# Patient Record
Sex: Female | Born: 1985 | Race: Black or African American | Hispanic: No | Marital: Married | Smoking: Former smoker
Health system: Southern US, Community
[De-identification: ages and names within clinical notes are randomized; demographics above are authoritative.]

## PROBLEM LIST (undated history)

## (undated) DIAGNOSIS — F329 Major depressive disorder, single episode, unspecified: Secondary | ICD-10-CM

## (undated) DIAGNOSIS — D649 Anemia, unspecified: Secondary | ICD-10-CM

## (undated) DIAGNOSIS — F32A Depression, unspecified: Secondary | ICD-10-CM

## (undated) DIAGNOSIS — F419 Anxiety disorder, unspecified: Secondary | ICD-10-CM

## (undated) HISTORY — PX: NO PAST SURGERIES: SHX2092

## (undated) HISTORY — DX: Anemia, unspecified: D64.9

---

## 2014-03-26 ENCOUNTER — Encounter (HOSPITAL_COMMUNITY): Payer: Self-pay | Admitting: Emergency Medicine

## 2014-03-26 ENCOUNTER — Emergency Department (HOSPITAL_COMMUNITY)
Admission: EM | Admit: 2014-03-26 | Discharge: 2014-03-26 | Disposition: A | Discharge status: Home / Self Care | Payer: 59 | Attending: Emergency Medicine | Admitting: Emergency Medicine

## 2014-03-26 DIAGNOSIS — Z3202 Encounter for pregnancy test, result negative: Secondary | ICD-10-CM | POA: Insufficient documentation

## 2014-03-26 DIAGNOSIS — F3289 Other specified depressive episodes: Secondary | ICD-10-CM | POA: Insufficient documentation

## 2014-03-26 DIAGNOSIS — F329 Major depressive disorder, single episode, unspecified: Secondary | ICD-10-CM | POA: Insufficient documentation

## 2014-03-26 DIAGNOSIS — F411 Generalized anxiety disorder: Secondary | ICD-10-CM | POA: Insufficient documentation

## 2014-03-26 DIAGNOSIS — F419 Anxiety disorder, unspecified: Secondary | ICD-10-CM

## 2014-03-26 HISTORY — DX: Anxiety disorder, unspecified: F41.9

## 2014-03-26 HISTORY — DX: Major depressive disorder, single episode, unspecified: F32.9

## 2014-03-26 HISTORY — DX: Depression, unspecified: F32.A

## 2014-03-26 LAB — CBC WITH DIFFERENTIAL/PLATELET
BASOS ABS: 0 10*3/uL (ref 0.0–0.1)
BASOS PCT: 1 % (ref 0–1)
EOS ABS: 0.1 10*3/uL (ref 0.0–0.7)
EOS PCT: 1 % (ref 0–5)
HCT: 35.9 % — ABNORMAL LOW (ref 36.0–46.0)
Hemoglobin: 12.4 g/dL (ref 12.0–15.0)
Lymphocytes Relative: 32 % (ref 12–46)
Lymphs Abs: 1.8 10*3/uL (ref 0.7–4.0)
MCH: 30.4 pg (ref 26.0–34.0)
MCHC: 34.5 g/dL (ref 30.0–36.0)
MCV: 88 fL (ref 78.0–100.0)
Monocytes Absolute: 0.4 10*3/uL (ref 0.1–1.0)
Monocytes Relative: 7 % (ref 3–12)
Neutro Abs: 3.2 10*3/uL (ref 1.7–7.7)
Neutrophils Relative %: 59 % (ref 43–77)
PLATELETS: 225 10*3/uL (ref 150–400)
RBC: 4.08 MIL/uL (ref 3.87–5.11)
RDW: 14.7 % (ref 11.5–15.5)
WBC: 5.5 10*3/uL (ref 4.0–10.5)

## 2014-03-26 LAB — COMPREHENSIVE METABOLIC PANEL
ALT: 12 U/L (ref 0–35)
AST: 16 U/L (ref 0–37)
Albumin: 4.2 g/dL (ref 3.5–5.2)
Alkaline Phosphatase: 73 U/L (ref 39–117)
Anion gap: 12 (ref 5–15)
BUN: 9 mg/dL (ref 6–23)
CALCIUM: 10 mg/dL (ref 8.4–10.5)
CO2: 25 mEq/L (ref 19–32)
Chloride: 100 mEq/L (ref 96–112)
Creatinine, Ser: 0.79 mg/dL (ref 0.50–1.10)
GFR calc non Af Amer: >90 mL/min (ref >90)
Glucose, Bld: 84 mg/dL (ref 70–99)
Potassium: 3.7 mEq/L (ref 3.7–5.3)
SODIUM: 137 meq/L (ref 137–147)
TOTAL PROTEIN: 8.1 g/dL (ref 6.0–8.3)
Total Bilirubin: 0.5 mg/dL (ref 0.3–1.2)

## 2014-03-26 LAB — POC URINE PREG, ED: Preg Test, Ur: NEGATIVE

## 2014-03-26 LAB — ETHANOL: Alcohol, Ethyl (B): <11 mg/dL (ref 0–11)

## 2014-03-26 LAB — RAPID URINE DRUG SCREEN, HOSP PERFORMED
Amphetamines: NOT DETECTED
BARBITURATES: NOT DETECTED
Benzodiazepines: NOT DETECTED
COCAINE: NOT DETECTED
Opiates: NOT DETECTED
TETRAHYDROCANNABINOL: POSITIVE — AB

## 2014-03-26 MED ORDER — LORAZEPAM 1 MG PO TABS: 1.0000 mg | ORAL_TABLET | Freq: Three times a day (TID) | ORAL | Status: DC | PRN

## 2014-03-26 NOTE — ED Notes (Signed)
Pt has blue pants blue bra white shirt pink underwear and black flip flops pts pocketbook and cell phone given to friend
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
## 2014-03-26 NOTE — [facility] Suicide Risk Assessment (Signed)
Suicide Risk Assessment  Discharge Assessment     Demographic Factors:  Gay, lesbian, or bisexual orientation  Total Time spent with patient: 30 minutes  Psychiatric Specialty Exam:     Blood pressure 111/70, pulse 66, temperature 98.3 F (36.8 C), temperature source Oral, resp. rate 16, SpO2 100.00%.There is no height or weight on file to calculate BMI.  General Appearance: Well Groomed  Engineer, water::  Good  Speech:  Clear and Coherent  Volume:  Normal  Mood:  Anxious  Affect:  Appropriate  Thought Process:  Negative  Orientation:  Full (Time, Place, and Person)  Thought Content:  Negative  Suicidal Thoughts:  No  Homicidal Thoughts:  No  Memory:  Immediate;   Good Recent;   Good Remote;   Good  Judgement:  Good  Insight:  Good  Psychomotor Activity:  Normal  Concentration:  Good  Recall:  Good  Fund of Knowledge:Good  Language: Good  Akathisia:  Negative  Handed:  Right  AIMS (if indicated):     Assets:  Communication Skills Desire for Improvement Financial Resources/Insurance Housing Intimacy Leisure Time [place] Talents/Skills Transportation Vocational/Educational  Sleep:       Musculoskeletal: Strength & Muscle Tone: within normal limits Gait & Station: normal Patient leans: N/A   Mental Status Per Nursing Assessment::   On Admission:     Current Mental Status by Physician: NA  Loss Factors: NA  Historical Factors: NA  Risk Reduction Factors:   Employed, Living with another person, especially a relative, Positive social support and Positive coping skills or problem solving skills  Continued Clinical Symptoms:  Severe Anxiety and/or Agitation  Cognitive Features That Contribute To Risk:  none  Suicide Risk:  Minimal: No identifiable suicidal ideation.  Patients presenting with no risk factors but with morbid ruminations; may be classified as minimal risk based on the severity of the depressive  symptoms  Discharge Diagnoses:   AXIS I:  Anxiety Disorder NOS AXIS II:  Deferred AXIS III:   Past Medical History  Diagnosis Date  . Depression   . Anxiety    AXIS IV:  family losses AXIS V:  61-70 mild symptoms  Plan Of Care/Follow-up recommendations:  Activity:  resume usual activity Diet:  resume usual diet  Is patient on multiple antipsychotic therapies at discharge:  No   Has Patient had three or more failed trials of antipsychotic monotherapy by history:  No  Recommended Plan for Multiple Antipsychotic Therapies: NA    Sandra Gregory D 03/26/2014, 3:05 PM

## 2014-03-26 NOTE — ED Notes (Signed)
Pt c/o increased anxiety, mood swings, and depression.  Report intermittent SI w/ plan to take pills.  However, Pt doesn't have access to pills.  Denies HI and hallucinations.  Denies medical complaints.

## 2014-03-26 NOTE — ED Provider Notes (Signed)
CSN: [id number]     Arrival date & time 03/26/14  8099 History   First MD Initiated Contact with Patient 03/26/14 0945     No chief complaint on file.    (Consider location/radiation/quality/duration/timing/severity/associated sxs/prior Treatment) HPI Comments: The patient presents to the emergency department for evaluation of anxiety and depression. Patient reports that she has had a long history of anxiety depression, going back all the way to what her mother died when she was 51. Patient has had suicidal thoughts and attempts in the past, but some has never had psychiatric treatment. The patient reports that recently she has had increasing symptoms, is concerned that she will become suicidal again. She currently is not suicidal or homicidal.   No past medical history on file. No past surgical history on file. No family history on file. History  Substance Use Topics  . Smoking status: Not on file  . Smokeless tobacco: Not on file  . Alcohol Use: Not on file   OB History   No data available     Review of Systems  Psychiatric/Behavioral: Positive for dysphoric mood. Negative for suicidal ideas. The patient is nervous/anxious.   All other systems reviewed and are negative.     Allergies  Review of patient's allergies indicates not on file.  Home Medications   Prior to Admission medications   Not on File   There were no vitals taken for this visit. Physical Exam  Constitutional: She is oriented to person, place, and time. She appears well-developed and well-nourished. No distress.  HENT:  Head: Normocephalic and atraumatic.  Right Ear: Hearing normal.  Left Ear: Hearing normal.  Nose: Nose normal.  Mouth/Throat: Oropharynx is clear and moist and mucous membranes are normal.  Eyes: Conjunctivae and EOM are normal. Pupils are equal, round, and reactive to light.  Neck: Normal range of motion. Neck supple.  Cardiovascular: Regular rhythm, S1 normal and S2 normal.  Exam  reveals no gallop and no friction rub.   No murmur heard. Pulmonary/Chest: Effort normal and breath sounds normal. No respiratory distress. She exhibits no tenderness.  Abdominal: Soft. Normal appearance and bowel sounds are normal. There is no hepatosplenomegaly. There is no tenderness. There is no rebound, no guarding, no tenderness at McBurney's point and negative Murphy's sign. No hernia.  Musculoskeletal: Normal range of motion.  Neurological: She is alert and oriented to person, place, and time. She has normal strength. No cranial nerve deficit or sensory deficit. Coordination normal. GCS eye subscore is 4. GCS verbal subscore is 5. GCS motor subscore is 6.  Skin: Skin is warm, dry and intact. No rash noted. No cyanosis.  Psychiatric: She has a normal mood and affect. Her speech is normal and behavior is normal. Thought content normal.    ED Course  Procedures (including critical care time) Labs Review Labs Reviewed - No data to display  Imaging Review No results found.   EKG Interpretation None      MDM   Final diagnoses:  None   anxiety  Depression  Presents to the ER for concerns of increasing anxiety and depression. Patient has a long history of untreated anxiety and depression, including suicidal behavior in the past. She currently is not homicidal or suicidal, but is concerned that her worsening symptoms indicate that she might become suicidal again. She has no other complaints, is medically clear for psychiatric evaluation.    Orpah Greek, MD 03/26/14 [phone]

## 2014-03-26 NOTE — ED Notes (Signed)
Pt belongings placed in locker 27

## 2014-03-26 NOTE — ED Notes (Signed)
Belongings returned to pt
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
## 2019-12-23 ENCOUNTER — Other Ambulatory Visit: Payer: Self-pay

## 2019-12-23 ENCOUNTER — Inpatient Hospital Stay (HOSPITAL_COMMUNITY): Payer: 59

## 2019-12-23 ENCOUNTER — Telehealth: Payer: Self-pay

## 2019-12-23 ENCOUNTER — Encounter (HOSPITAL_COMMUNITY): Payer: Self-pay | Admitting: Obstetrics and Gynecology

## 2019-12-23 ENCOUNTER — Inpatient Hospital Stay (HOSPITAL_COMMUNITY)
Admission: AD | Admit: 2019-12-23 | Discharge: 2019-12-24 | Disposition: A | Discharge status: Home / Self Care | Payer: 59 | Attending: Obstetrics and Gynecology | Admitting: Obstetrics and Gynecology

## 2019-12-23 DIAGNOSIS — Z3A15 15 weeks gestation of pregnancy: Secondary | ICD-10-CM | POA: Diagnosis not present

## 2019-12-23 DIAGNOSIS — O99891 Other specified diseases and conditions complicating pregnancy: Secondary | ICD-10-CM | POA: Diagnosis not present

## 2019-12-23 DIAGNOSIS — O3412 Maternal care for benign tumor of corpus uteri, second trimester: Secondary | ICD-10-CM | POA: Diagnosis not present

## 2019-12-23 DIAGNOSIS — D259 Leiomyoma of uterus, unspecified: Secondary | ICD-10-CM

## 2019-12-23 DIAGNOSIS — Z87891 Personal history of nicotine dependence: Secondary | ICD-10-CM | POA: Diagnosis not present

## 2019-12-23 DIAGNOSIS — D251 Intramural leiomyoma of uterus: Secondary | ICD-10-CM | POA: Insufficient documentation

## 2019-12-23 DIAGNOSIS — R1031 Right lower quadrant pain: Secondary | ICD-10-CM

## 2019-12-23 DIAGNOSIS — O26892 Other specified pregnancy related conditions, second trimester: Secondary | ICD-10-CM | POA: Diagnosis present

## 2019-12-23 LAB — URINALYSIS, ROUTINE W REFLEX MICROSCOPIC
Bilirubin Urine: NEGATIVE
Glucose, UA: NEGATIVE mg/dL
Hgb urine dipstick: NEGATIVE
Ketones, ur: NEGATIVE mg/dL
Leukocytes,Ua: NEGATIVE
Nitrite: NEGATIVE
Protein, ur: NEGATIVE mg/dL
Specific Gravity, Urine: 1.01 (ref 1.005–1.030)
pH: 6 (ref 5.0–8.0)

## 2019-12-23 LAB — COMPREHENSIVE METABOLIC PANEL
ALT: 12 U/L (ref 0–44)
AST: 14 U/L — ABNORMAL LOW (ref 15–41)
Albumin: 3.5 g/dL (ref 3.5–5.0)
Alkaline Phosphatase: 46 U/L (ref 38–126)
Anion gap: 9 (ref 5–15)
BUN: 6 mg/dL (ref 6–20)
CO2: 24 mmol/L (ref 22–32)
Calcium: 9.3 mg/dL (ref 8.9–10.3)
Chloride: 100 mmol/L (ref 98–111)
Creatinine, Ser: 0.76 mg/dL (ref 0.44–1.00)
GFR calc Af Amer: >60 mL/min (ref >60)
GFR calc non Af Amer: >60 mL/min (ref >60)
Glucose, Bld: 90 mg/dL (ref 70–99)
Potassium: 3.4 mmol/L — ABNORMAL LOW (ref 3.5–5.1)
Sodium: 133 mmol/L — ABNORMAL LOW (ref 135–145)
Total Bilirubin: 0.6 mg/dL (ref 0.3–1.2)
Total Protein: 7 g/dL (ref 6.5–8.1)

## 2019-12-23 LAB — CBC
HCT: 32.6 % — ABNORMAL LOW (ref 36.0–46.0)
Hemoglobin: 11.3 g/dL — ABNORMAL LOW (ref 12.0–15.0)
MCH: 31.5 pg (ref 26.0–34.0)
MCHC: 34.7 g/dL (ref 30.0–36.0)
MCV: 90.8 fL (ref 80.0–100.0)
Platelets: 226 10*3/uL (ref 150–400)
RBC: 3.59 MIL/uL — ABNORMAL LOW (ref 3.87–5.11)
RDW: 13 % (ref 11.5–15.5)
WBC: 10 10*3/uL (ref 4.0–10.5)
nRBC: 0 % (ref 0.0–0.2)

## 2019-12-23 LAB — WET PREP, GENITAL
Clue Cells Wet Prep HPF POC: NONE SEEN
Sperm: NONE SEEN
Trich, Wet Prep: NONE SEEN
Yeast Wet Prep HPF POC: NONE SEEN

## 2019-12-23 LAB — POCT PREGNANCY, URINE: Preg Test, Ur: POSITIVE — AB

## 2019-12-23 MED ORDER — HYDROMORPHONE HCL 1 MG/ML IJ SOLN: 0.5000 mg | Freq: Once | INTRAMUSCULAR | Status: DC

## 2019-12-23 MED ORDER — HYDROMORPHONE HCL 1 MG/ML IJ SOLN
0.5000 mg | Freq: Once | INTRAMUSCULAR | Status: AC
  Administered 2019-12-23: 20:00:00 0.5 mg via INTRAMUSCULAR
  Filled 2019-12-23: qty 1

## 2019-12-23 NOTE — MAU Provider Note (Addendum)
History   Sandra Gregory is a 34 yo G2P1001 at approximately [redacted] wks gestation who presents with sharp pain in the RLQ. Exact gestational age is unknown. She and her wife have been trying to get pregnant using the "[place]" method with her wife's brother's sperm since last June. She believed herself to be at [redacted] weeks gestation based on some vaginal bleeding she had in February that she believed to be menses. However, she believes that she is "showing" more than 8 weeks. Her fundal height is 15 cm.     Her abdominal pain started around 1am and has been persistent since. At first the pain was present along her entire R flank, but has since localized to the the RLQ. She describes it as "shooting" pain that is a 5-6/10 at rest and a 10/10 with movement. She is most comfortable with her hips flexed and turned slightly on her R side. She reports that she cannot lie on her L side due to the severity of the pain. She has not been eating, but has been able to maintain PO fluid intake.   She has not taken any medications for the pain, but did try to take a warm bath with no relief of her symptoms. She endorses mild to moderate nausea but denies any nausea. She denies back pain, dysuria, or changes in urinary frequency. She has not had a stool today, but reports normal stools yesterday. She reports clear vaginal discharge but no bleeding.    CSN: [id number]  Arrival date and time: 12/23/19 1624   First Provider Initiated Contact with Patient 12/23/19 1719      Chief Complaint  Patient presents with  . Abdominal Pain    OB History    Gravida  2   Para  1   Term  1   Preterm      AB      Living  1     SAB      TAB      Ectopic      Multiple      Live Births  1           Past Medical History:  Diagnosis Date  . Anxiety   . Depression     Past Surgical History:  Procedure Laterality Date  . NO PAST SURGERIES      No family history on file.  Social History   Tobacco Use   . Smoking status: Former Smoker    Types: Cigarettes    Quit date: 11/22/2019    Years since quitting: 0.0  . Smokeless tobacco: Never Used  Substance Use Topics  . Alcohol use: Not Currently  . Drug use: Not Currently    Types: Marijuana    Comment: not since pregnancy    Allergies: No Known Allergies  Medications Prior to Admission  Medication Sig Dispense Refill Last Dose  . Prenatal Vit-Fe Fumarate-FA (PRENATAL MULTIVITAMIN) TABS tablet Take 1 tablet by mouth daily at 12 noon.     . diphenhydrAMINE (BENADRYL) 25 MG tablet Take 50 mg by mouth every 6 (six) hours as needed for allergies.     . fexofenadine (ALLEGRA) 180 MG tablet Take 180 mg by mouth daily.       Review of Systems  Constitutional: Negative.   HENT: Negative.   Eyes: Negative.   Cardiovascular: Negative.   Gastrointestinal: Positive for abdominal pain and nausea. Negative for abdominal distention, anal bleeding, blood in stool, constipation, diarrhea, rectal pain and vomiting.  Endocrine: Negative.   Genitourinary: Positive for vaginal discharge. Negative for difficulty urinating, dysuria, flank pain, vaginal bleeding and vaginal pain.  Musculoskeletal: Negative for back pain.  Skin: Negative.   Allergic/Immunologic: Negative.   Neurological: Negative.   Hematological: Negative.   Psychiatric/Behavioral: Negative.    Physical Exam   Blood pressure 123/75, pulse 91, temperature 99.5 F (37.5 C), temperature source Oral, resp. rate 17, height 5\' 6"  (1.676 m), weight 87.6 kg, last menstrual period 10/25/2019, SpO2 99 %.  Physical Exam  Constitutional: She appears well-developed and well-nourished.  Cardiovascular: Normal rate and regular rhythm. Exam reveals no gallop and no friction rub.  No murmur heard. Respiratory: Effort normal and breath sounds normal. No respiratory distress. She has no wheezes. She has no rales.  GI: She exhibits no distension and no mass. There is abdominal tenderness. There is  guarding. There is no rebound.  Pos. Rovsing sign Neg. McBurney's sign Neg. Psoas sign Neg. Obturator sign No ascites  Genitourinary:    Vaginal discharge (pale) present.     Genitourinary Comments: No cervical motion tenderness   Skin: Skin is warm and dry.    MAU Course   MDM - Wet prep significant for moderate WBCs - Ordered STAT MRI abdomen & pelvis  - Ordered CBC & CMP to rule out appendicitis   Results for orders placed or performed during the hospital encounter of 12/23/19 (from the past 24 hour(s))  Pregnancy, urine POC     Status: Abnormal   Collection Time: 12/23/19  4:48 PM  Result Value Ref Range   Preg Test, Ur POSITIVE (A) NEGATIVE  Urinalysis, Routine w reflex microscopic     Status: None   Collection Time: 12/23/19  4:50 PM  Result Value Ref Range   Color, Urine YELLOW YELLOW   APPearance CLEAR CLEAR   Specific Gravity, Urine 1.010 1.005 - 1.030   pH 6.0 5.0 - 8.0   Glucose, UA NEGATIVE NEGATIVE mg/dL   Hgb urine dipstick NEGATIVE NEGATIVE   Bilirubin Urine NEGATIVE NEGATIVE   Ketones, ur NEGATIVE NEGATIVE mg/dL   Protein, ur NEGATIVE NEGATIVE mg/dL   Nitrite NEGATIVE NEGATIVE   Leukocytes,Ua NEGATIVE NEGATIVE  Wet prep, genital     Status: Abnormal   Collection Time: 12/23/19  5:45 PM   Specimen: Vaginal  Result Value Ref Range   Yeast Wet Prep HPF POC NONE SEEN NONE SEEN   Trich, Wet Prep NONE SEEN NONE SEEN   Clue Cells Wet Prep HPF POC NONE SEEN NONE SEEN   WBC, Wet Prep HPF POC MODERATE (A) NONE SEEN   Sperm NONE SEEN   Comprehensive metabolic panel     Status: Abnormal   Collection Time: 12/23/19  6:10 PM  Result Value Ref Range   Sodium 133 (L) 135 - 145 mmol/L   Potassium 3.4 (L) 3.5 - 5.1 mmol/L   Chloride 100 98 - 111 mmol/L   CO2 24 22 - 32 mmol/L   Glucose, Bld 90 70 - 99 mg/dL   BUN 6 6 - 20 mg/dL   Creatinine, Ser 0.76 0.44 - 1.00 mg/dL   Calcium 9.3 8.9 - 10.3 mg/dL   Total Protein 7.0 6.5 - 8.1 g/dL   Albumin 3.5 3.5 - 5.0  g/dL   AST 14 (L) 15 - 41 U/L   ALT 12 0 - 44 U/L   Alkaline Phosphatase 46 38 - 126 U/L   Total Bilirubin 0.6 0.3 - 1.2 mg/dL   GFR calc non Af Amer >60 >  60 mL/min   GFR calc Af Amer >60 >60 mL/min   Anion gap 9 5 - 15  CBC     Status: Abnormal   Collection Time: 12/23/19  6:10 PM  Result Value Ref Range   WBC 10.0 4.0 - 10.5 K/uL   RBC 3.59 (L) 3.87 - 5.11 MIL/uL   Hemoglobin 11.3 (L) 12.0 - 15.0 g/dL   HCT 32.6 (L) 36.0 - 46.0 %   MCV 90.8 80.0 - 100.0 fL   MCH 31.5 26.0 - 34.0 pg   MCHC 34.7 30.0 - 36.0 g/dL   RDW 13.0 11.5 - 15.5 %   Platelets 226 150 - 400 K/uL   nRBC 0.0 0.0 - 0.2 %   Pearla Dubonnet 12/23/2019, 5:50 PM   GME ATTESTATION:  I saw and evaluated the patient. I agree with the findings and the plan of care as documented in the student's note.  Merilyn Baba, DO OB Fellow, [place] 12/23/2019 10:03 PM  RECHECK 2000: - Pain better with IM dilaudid - Still need MRI  RECHECK 2200: - Pain still well controlled - Asking for ginger ale and saltine - MRI report pending -Sign out to Dr. Dione Plover at [place], Dan Europe, DO OB Fellow, [facility] 12/23/2019 10:03 PM   Assessment and Plan  Sandra Gregory is a 34 yo G2P1001 at approximately [redacted] wks gestation who presented for a ~16 hour history of sharp RLQ pain concerning for appendicitis vs. Tubo-ovarian infection vs. Ovarian torsion - MRI abdomen & pelvis to assess for appendicitis, ovarian torsion, or abscess  - CBC & CMP     Pearla Dubonnet 12/23/2019, 5:50 PM     Patient MRI not final as no body radiologist available to finalize overnight, however spoke with Dr. Nyoka Cowden and preliminarily showed significant burden of fiboirds as well as IUP with large R adenxa c/f possible torsion, no evidence of appendicitis. Per our discussion subsequently ordered a TVUS:  US OB Comp Less 14 Wks  Result Date: 12/24/2019 CLINICAL DATA:  Initial evaluation for  acute right lower quadrant pain. Early pregnancy. EXAM: OBSTETRIC <14 WK ULTRASOUND TECHNIQUE: Transabdominal ultrasound was performed for evaluation of the gestation as well as the maternal uterus and adnexal regions. COMPARISON:  None available. FINDINGS: Intrauterine gestational sac: Single Yolk sac:  Present Embryo:  Present Cardiac Activity: Present Heart Rate: 174 bpm CRL: 19.3 mm   8 w 3 d                  [place] EDC: 08/01/2020 Subchorionic hemorrhage:  None visualized. Maternal uterus/adnexae: Ovaries are normal in appearance bilaterally. 2.6 x 2.5 x 2.1 cm degenerating corpus luteal cyst present within the left ovary. No free fluid within the pelvis. Multiple scattered fibroid seen without the uterus. Largest of these present at the lower uterine segment and measures 9.9 x 7.2 x 8.1 cm. Fundal fibroid measuring 7.9 x 7.1 x 8.8 cm present. 4.3 x 4.2 x 3.3 cm intramural fibroid present at the right uterus. IMPRESSION: 1. Single viable intrauterine pregnancy as above, estimated gestational age [redacted] weeks and 3 days by crown-rump length, with ultrasound EDC of 08/01/2020. No complication. 2. Enlarged fibroid uterus as detailed above. 3. 2.6 cm degenerating left ovarian corpus luteal cyst. Electronically Signed   By: Jeannine Boga M.D.   On: 12/24/2019 01:36   IUP confirmed, c/w dates, and no evidence of torsion on ultrasound, and per my discussion with sonographer area of pain  corresponded well to area that appeared c/w degenerating fibroid.  Discussed results with patient, given large burden of fibroids and US findings, degenerating fibroid is the most likely etiology of her pain and other more acute causes are essentially ruled out at this point. Discussed there may be significant impact on the pregnancy given her fibroid burden, she has an appointment with Dr. Ihor Dow later this week and they will discuss further at that time. Reviewed use of tylenol for pain but may need to discuss other  medications at follow up visit if ineffective. D/c to home in stable condition.     Clarnce Flock MD/MPH 3:07 AM 12/24/19

## 2019-12-23 NOTE — MAU Note (Signed)
Woke up during the night with sharp pain in RLQ.  Thought it would get better and it is getting worse. No bleeding.  Believes she is 8 wks preg.  First OB appt is Thu, called them and was instructed to come here. (insemination Jan and Feb, last period was Feb, +HPT in March)

## 2019-12-23 NOTE — Telephone Encounter (Signed)
Pt called the office stating she is [redacted] wks pregnant and she has been having sharp stabbing pain on her right side since 1 am this morning. Pt states she is not bleeding but the pain has gotten worse. Advised pt to go to [facility] at [facility] to be seen. Understanding was voiced.  Wissam Resor l Roslynn Holte, CMA

## 2019-12-24 ENCOUNTER — Inpatient Hospital Stay (HOSPITAL_COMMUNITY): Payer: 59

## 2019-12-24 NOTE — Discharge Instructions (Signed)

## 2019-12-26 ENCOUNTER — Other Ambulatory Visit: Payer: Self-pay

## 2019-12-26 ENCOUNTER — Other Ambulatory Visit (HOSPITAL_COMMUNITY)
Admission: RE | Admit: 2019-12-26 | Discharge: 2019-12-26 | Disposition: A | Discharge status: Home / Self Care | Payer: 59 | Source: Ambulatory Visit | Attending: Obstetrics & Gynecology | Admitting: Obstetrics & Gynecology

## 2019-12-26 ENCOUNTER — Ambulatory Visit (INDEPENDENT_AMBULATORY_CARE_PROVIDER_SITE_OTHER): Payer: 59 | Admitting: Obstetrics & Gynecology

## 2019-12-26 ENCOUNTER — Telehealth: Payer: Self-pay

## 2019-12-26 ENCOUNTER — Encounter: Payer: Self-pay | Admitting: Obstetrics & Gynecology

## 2019-12-26 VITALS — BP 122/70 | HR 89 | Wt 194.0 lb

## 2019-12-26 DIAGNOSIS — Z3480 Encounter for supervision of other normal pregnancy, unspecified trimester: Secondary | ICD-10-CM

## 2019-12-26 DIAGNOSIS — D259 Leiomyoma of uterus, unspecified: Secondary | ICD-10-CM

## 2019-12-26 DIAGNOSIS — O99330 Smoking (tobacco) complicating pregnancy, unspecified trimester: Secondary | ICD-10-CM

## 2019-12-26 DIAGNOSIS — O3410 Maternal care for benign tumor of corpus uteri, unspecified trimester: Secondary | ICD-10-CM

## 2019-12-26 MED ORDER — TRAMADOL HCL 50 MG PO TABS: 50.0000 mg | ORAL_TABLET | Freq: Four times a day (QID) | ORAL | 2 refills | Status: DC | PRN

## 2019-12-26 NOTE — Progress Notes (Signed)
  Subjective:    Sandra Gregory is being seen today for her first obstetrical visit.  This is a planned pregnancy. She is at [redacted]w[redacted]d gestation. Her obstetrical history is significant for smoker and multiple large fibroids . Relationship with partner. Monogamous. Same sex partner. Donor sperm. Patient does intend to breast feed. Pregnancy history fully reviewed.  Patient reports nausea; no emesis. Severe pain related to fibroids. Pt takes Tylenol for pain relief. It does help somewhat. She has not been working due to the pain.     Review of Systems:   Review of Systems  Objective:     BP 122/70   Pulse 89   Wt 194 lb (88 kg)   LMP 10/25/2019   BMI 31.31 kg/m  Physical Exam  Exam General Appearance:    Alert, cooperative, no distress, appears stated age  Head:    Normocephalic, without obvious abnormality, atraumatic  Eyes:    conjunctiva/corneas clear, EOM's intact, both eyes  Ears:    Normal external ear canals, both ears  Nose:   Nares normal, septum midline, mucosa normal, no drainage    or sinus tenderness  Throat:   Lips, mucosa, and tongue normal; teeth and gums normal  Neck:   Supple, symmetrical, trachea midline, no adenopathy;    thyroid:  no enlargement/tenderness/nodules  Back:     Symmetric, no curvature, ROM normal, no CVA tenderness  Lungs:     respirations unlabored  Chest Wall:    No tenderness or deformity   Heart:    Regular rate and rhythm  Breast Exam:    No tenderness, masses, or nipple abnormality  Abdomen:     Soft, non-tender, bowel sounds active all four quadrants,    no masses, no organomegaly  Genitalia:    Normal female without lesion, discharge or tenderness   Uterus 17 weeks sized and tender to palpation. There is a post fibroid that pushes the cervix very ant. I could not visualize the cervix but, I could palpate it very ant. It is soft and feels patent. It appears to be effacing    Extremities:   Extremities normal, atraumatic, no cyanosis or edema   Pulses:   2+ and symmetric all extremities  Skin:   Skin color, texture, turgor normal, no rashes or lesions     Assessment:    Pregnancy: G2P1001 Patient Active Problem List   Diagnosis Date Noted  . Supervision of other normal pregnancy, antepartum 12/26/2019  . Uterine fibroids affecting pregnancy, antepartum 12/26/2019  Tob and THC use in early pregnancy. Stopped 4 weeks prev.     I reviewed with pt the risks assoc with fibroids to this extend including the increased risk of PTL and very likely need for a c/s at the time of delivery.     Plan:     Initial labs drawn. Prenatal vitamins. Problem list reviewed and updated. AFP3 discussed: requested. Role of ultrasound in pregnancy discussed; fetal survey: requested. Amniocentesis discussed: not indicated. Follow up in 4 weeks. 60% of 40 min visit spent on counseling and coordination of care.  TV US for cervical length.  Tramadol prn severe pain   Lavonia Drafts 12/26/2019

## 2019-12-26 NOTE — Progress Notes (Signed)
Per pt's request,Tramadol 50 mg PO q6h for severe pain was canceled at [facility] on [address]. Tramadol 50 mg PO q6h was e-prescribed to [facility] in [place]. Ileigh Mettler l Miles Leyda, CMA

## 2019-12-26 NOTE — Telephone Encounter (Signed)
Pt called stating she is having some light pinkish/brown spotting. Pt states that the spotting started started after her OV today. Pt made aware that the spotting could be from having her PAP smear today. Explained to pt that the cervix is very sensitive during pregnancy and sometimes spotting occurs after intercourse and a vaginal exam. Advised pt to go to [facility] at [facility] if she starts to bleed heavy like a period. Understanding was voiced.  Nicola Quesnell l Eisley Barber, CMA

## 2019-12-26 NOTE — Patient Instructions (Signed)
Common Medications Safe in Pregnancy  Acne:      Constipation:  Benzoyl Peroxide     Colace  Clindamycin      Dulcolax Suppository  Topica Erythromycin     Fibercon  Salicylic Acid      Metamucil         Miralax AVOID:        Senakot   Accutane    Cough:  Retin-A       Cough Drops  Tetracycline      Phenergan w/ Codeine if Rx  Minocycline      Robitussin (Plain & DM)  Antibiotics:     Crabs/Lice:  Ceclor       RID  Cephalosporins    AVOID:  E-Mycins      Kwell  Keflex  Macrobid/Macrodantin   Diarrhea:  Penicillin      Kao-Pectate  Zithromax      Imodium AD         PUSH FLUIDS AVOID:       Cipro     Fever:  Tetracycline      Tylenol (Regular or Extra  Minocycline       Strength)  Levaquin      Extra Strength-Do not          Exceed 8 tabs/24 hrs Caffeine:        <200mg/day (equiv. To 1 cup of coffee or  approx. 3 12 oz sodas)         Gas: Cold/Hayfever:       Gas-X  Benadryl      Mylicon  Claritin       Phazyme  **Claritin-D        Chlor-Trimeton    Headaches:  Dimetapp      ASA-Free Excedrin  Drixoral-Non-Drowsy     Cold Compress  Mucinex (Guaifenasin)     Tylenol (Regular or Extra  Sudafed/Sudafed-12 Hour     Strength)  **Sudafed PE Pseudoephedrine   Tylenol Cold & Sinus     Vicks Vapor Rub  Zyrtec  **AVOID if Problems With Blood Pressure         Heartburn: Avoid lying down for at least 1 hour after meals  Aciphex      Maalox     Rash:  Milk of Magnesia     Benadryl    Mylanta       1% Hydrocortisone Cream  Pepcid  Pepcid Complete   Sleep Aids:  Prevacid      Ambien   Prilosec       Benadryl  Rolaids       Chamomile Tea  Tums (Limit 4/day)     Unisom  Zantac       Tylenol PM         Warm milk-add vanilla or  Hemorrhoids:       Sugar for taste  Anusol/Anusol H.C.  (RX: Analapram 2.5%)  Sugar Substitutes:  Hydrocortisone OTC     Ok in moderation  Preparation H      Tucks        Vaseline lotion applied to tissue with  wiping    Herpes:     Throat:  Acyclovir      Oragel  Famvir  Valtrex     Vaccines:         Flu Shot Leg Cramps:       *Gardasil  Benadryl      Hepatitis A         Hepatitis B Nasal Spray:         Pneumovax  Saline Nasal Spray     Polio Booster         Tetanus Nausea:       Tuberculosis test or PPD  Vitamin B6 25 mg TID   AVOID:    Dramamine      *Gardasil  Emetrol       Live Poliovirus  Ginger Root 250 mg QID    MMR (measles, mumps &  High Complex Carbs @ Bedtime    rebella)  Sea Bands-Accupressure    Varicella (Chickenpox)  Unisom 1/2 tab TID     *No known complications           If received before Pain:         Known pregnancy;   Darvocet       Resume series after  Lortab        Delivery  Percocet    Yeast:   Tramadol      Femstat  Tylenol 3      Gyne-lotrimin  Ultram       Monistat  Vicodin           MISC:         All Sunscreens           Hair Coloring/highlights          Insect Repellant's          (Including DEET)         Mystic Tans First Trimester of Pregnancy The first trimester of pregnancy is from week 1 until the end of week 13 (months 1 through 3). A week after a sperm fertilizes an egg, the egg will implant on the wall of the uterus. This embryo will begin to develop into a baby. Genes from you and your partner will form the baby. The female genes will determine whether the baby will be a boy or a girl. At 6-8 weeks, the eyes and face will be formed, and the heartbeat can be seen on ultrasound. At the end of 12 weeks, all the baby's organs will be formed. Now that you are pregnant, you will want to do everything you can to have a healthy baby. Two of the most important things are to get good prenatal care and to follow your health care provider's instructions. Prenatal care is all the medical care you receive before the baby's birth. This care will help prevent, find, and treat any problems during the pregnancy and childbirth. Body changes during your first  trimester Your body goes through many changes during pregnancy. The changes vary from woman to woman.  You may gain or lose a couple of pounds at first.  You may feel sick to your stomach (nauseous) and you may throw up (vomit). If the vomiting is uncontrollable, call your health care provider.  You may tire easily.  You may develop headaches that can be relieved by medicines. All medicines should be approved by your health care provider.  You may urinate more often. Painful urination may mean you have a bladder infection.  You may develop heartburn as a result of your pregnancy.  You may develop constipation because certain hormones are causing the muscles that push stool through your intestines to slow down.  You may develop hemorrhoids or swollen veins (varicose veins).  Your breasts may begin to grow larger and become tender. Your nipples may stick out more, and the tissue that surrounds them (areola) may become darker.  Your gums may bleed and may be sensitive to  brushing and flossing.  Dark spots or blotches (chloasma, mask of pregnancy) may develop on your face. This will likely fade after the baby is born.  Your menstrual periods will stop.  You may have a loss of appetite.  You may develop cravings for certain kinds of food.  You may have changes in your emotions from day to day, such as being excited to be pregnant or being concerned that something may go wrong with the pregnancy and baby.  You may have more vivid and strange dreams.  You may have changes in your hair. These can include thickening of your hair, rapid growth, and changes in texture. Some women also have hair loss during or after pregnancy, or hair that feels dry or thin. Your hair will most likely return to normal after your baby is born. What to expect at prenatal visits During a routine prenatal visit:  You will be weighed to make sure you and the baby are growing normally.  Your blood pressure  will be taken.  Your abdomen will be measured to track your baby's growth.  The fetal heartbeat will be listened to between weeks 10 and 14 of your pregnancy.  Test results from any previous visits will be discussed. Your health care provider may ask you:  How you are feeling.  If you are feeling the baby move.  If you have had any abnormal symptoms, such as leaking fluid, bleeding, severe headaches, or abdominal cramping.  If you are using any tobacco products, including cigarettes, chewing tobacco, and electronic cigarettes.  If you have any questions. Other tests that may be performed during your first trimester include:  Blood tests to find your blood type and to check for the presence of any previous infections. The tests will also be used to check for low iron levels (anemia) and protein on red blood cells (Rh antibodies). Depending on your risk factors, or if you previously had diabetes during pregnancy, you may have tests to check for high blood sugar that affects pregnant women (gestational diabetes).  Urine tests to check for infections, diabetes, or protein in the urine.  An ultrasound to confirm the proper growth and development of the baby.  Fetal screens for spinal cord problems (spina bifida) and Down syndrome.  HIV (human immunodeficiency virus) testing. Routine prenatal testing includes screening for HIV, unless you choose not to have this test.  You may need other tests to make sure you and the baby are doing well. Follow these instructions at home: Medicines  Follow your health care provider's instructions regarding medicine use. Specific medicines may be either safe or unsafe to take during pregnancy.  Take a prenatal vitamin that contains at least 600 micrograms (mcg) of folic acid.  If you develop constipation, try taking a stool softener if your health care provider approves. Eating and drinking   Eat a balanced diet that includes fresh fruits and  vegetables, whole grains, good sources of protein such as meat, eggs, or tofu, and low-fat dairy. Your health care provider will help you determine the amount of weight gain that is right for you.  Avoid raw meat and uncooked cheese. These carry germs that can cause birth defects in the baby.  Eating four or five small meals rather than three large meals a day may help relieve nausea and vomiting. If you start to feel nauseous, eating a few soda crackers can be helpful. Drinking liquids between meals, instead of during meals, also seems to help ease nausea  and vomiting.  Limit foods that are high in fat and processed sugars, such as fried and sweet foods.  To prevent constipation: ? Eat foods that are high in fiber, such as fresh fruits and vegetables, whole grains, and beans. ? Drink enough fluid to keep your urine clear or pale yellow. Activity  Exercise only as directed by your health care provider. Most women can continue their usual exercise routine during pregnancy. Try to exercise for 30 minutes at least 5 days a week. Exercising will help you: ? Control your weight. ? Stay in shape. ? Be prepared for labor and delivery.  Experiencing pain or cramping in the lower abdomen or lower back is a good sign that you should stop exercising. Check with your health care provider before continuing with normal exercises.  Try to avoid standing for long periods of time. Move your legs often if you must stand in one place for a long time.  Avoid heavy lifting.  Wear low-heeled shoes and practice good posture.  You may continue to have sex unless your health care provider tells you not to. Relieving pain and discomfort  Wear a good support bra to relieve breast tenderness.  Take warm sitz baths to soothe any pain or discomfort caused by hemorrhoids. Use hemorrhoid cream if your health care provider approves.  Rest with your legs elevated if you have leg cramps or low back pain.  If you  develop varicose veins in your legs, wear support hose. Elevate your feet for 15 minutes, 3-4 times a day. Limit salt in your diet. Prenatal care  Schedule your prenatal visits by the twelfth week of pregnancy. They are usually scheduled monthly at first, then more often in the last 2 months before delivery.  Write down your questions. Take them to your prenatal visits.  Keep all your prenatal visits as told by your health care provider. This is important. Safety  Wear your seat belt at all times when driving.  Make a list of emergency phone numbers, including numbers for family, friends, the hospital, and police and fire departments. General instructions  Ask your health care provider for a referral to a local prenatal education class. Begin classes no later than the beginning of month 6 of your pregnancy.  Ask for help if you have counseling or nutritional needs during pregnancy. Your health care provider can offer advice or refer you to specialists for help with various needs.  Do not use hot tubs, steam rooms, or saunas.  Do not douche or use tampons or scented sanitary pads.  Do not cross your legs for long periods of time.  Avoid cat litter boxes and soil used by cats. These carry germs that can cause birth defects in the baby and possibly loss of the fetus by miscarriage or stillbirth.  Avoid all smoking, herbs, alcohol, and medicines not prescribed by your health care provider. Chemicals in these products affect the formation and growth of the baby.  Do not use any products that contain nicotine or tobacco, such as cigarettes and e-cigarettes. If you need help quitting, ask your health care provider. You may receive counseling support and other resources to help you quit.  Schedule a dentist appointment. At home, brush your teeth with a soft toothbrush and be gentle when you floss. Contact a health care provider if:  You have dizziness.  You have mild pelvic cramps, pelvic  pressure, or nagging pain in the abdominal area.  You have persistent nausea, vomiting, or diarrhea.  You have a bad smelling vaginal discharge.  You have pain when you urinate.  You notice increased swelling in your face, hands, legs, or ankles.  You are exposed to fifth disease or chickenpox.  You are exposed to [place] measles (rubella) and have never had it. Get help right away if:  You have a fever.  You are leaking fluid from your vagina.  You have spotting or bleeding from your vagina.  You have severe abdominal cramping or pain.  You have rapid weight gain or loss.  You vomit blood or material that looks like coffee grounds.  You develop a severe headache.  You have shortness of breath.  You have any kind of trauma, such as from a fall or a car accident. Summary  The first trimester of pregnancy is from week 1 until the end of week 13 (months 1 through 3).  Your body goes through many changes during pregnancy. The changes vary from woman to woman.  You will have routine prenatal visits. During those visits, your health care provider will examine you, discuss any test results you may have, and talk with you about how you are feeling. This information is not intended to replace advice given to you by your health care provider. Make sure you discuss any questions you have with your health care provider. Document Revised: 08/18/2017 Document Reviewed: 08/17/2016 Elsevier Patient Education  2020 [employer].

## 2019-12-26 NOTE — Progress Notes (Signed)
Patient had ultrasound for viability on 12/24/2019. Revealed multiple fibroids affecting pregnancy. Kathrene Alu RN

## 2019-12-27 ENCOUNTER — Ambulatory Visit (HOSPITAL_COMMUNITY): Payer: 59 | Admitting: *Deleted

## 2019-12-27 ENCOUNTER — Other Ambulatory Visit: Payer: Self-pay | Admitting: Obstetrics & Gynecology

## 2019-12-27 ENCOUNTER — Encounter (HOSPITAL_COMMUNITY): Payer: Self-pay

## 2019-12-27 ENCOUNTER — Other Ambulatory Visit (HOSPITAL_COMMUNITY): Payer: Self-pay | Admitting: *Deleted

## 2019-12-27 ENCOUNTER — Ambulatory Visit (HOSPITAL_COMMUNITY)
Admission: RE | Admit: 2019-12-27 | Discharge: 2019-12-27 | Disposition: A | Discharge status: Home / Self Care | Payer: 59 | Source: Ambulatory Visit | Attending: Obstetrics & Gynecology | Admitting: Obstetrics & Gynecology

## 2019-12-27 DIAGNOSIS — Z3480 Encounter for supervision of other normal pregnancy, unspecified trimester: Secondary | ICD-10-CM | POA: Insufficient documentation

## 2019-12-27 DIAGNOSIS — O3410 Maternal care for benign tumor of corpus uteri, unspecified trimester: Secondary | ICD-10-CM

## 2019-12-27 DIAGNOSIS — O3411 Maternal care for benign tumor of corpus uteri, first trimester: Secondary | ICD-10-CM | POA: Diagnosis not present

## 2019-12-27 DIAGNOSIS — D259 Leiomyoma of uterus, unspecified: Secondary | ICD-10-CM | POA: Diagnosis present

## 2019-12-27 DIAGNOSIS — Z3A09 9 weeks gestation of pregnancy: Secondary | ICD-10-CM | POA: Diagnosis not present

## 2019-12-27 DIAGNOSIS — O4691 Antepartum hemorrhage, unspecified, first trimester: Secondary | ICD-10-CM | POA: Diagnosis not present

## 2019-12-28 LAB — CULTURE, OB URINE

## 2019-12-28 LAB — URINE CULTURE, OB REFLEX

## 2019-12-30 ENCOUNTER — Telehealth: Payer: Self-pay

## 2019-12-30 LAB — OBSTETRIC PANEL, INCLUDING HIV
Antibody Screen: NEGATIVE
Basophils Absolute: 0 10*3/uL (ref 0.0–0.2)
Basos: 0 %
EOS (ABSOLUTE): 0.1 10*3/uL (ref 0.0–0.4)
Eos: 1 %
HIV Screen 4th Generation wRfx: NONREACTIVE
Hematocrit: 35.1 % (ref 34.0–46.6)
Hemoglobin: 11.7 g/dL (ref 11.1–15.9)
Hepatitis B Surface Ag: NEGATIVE
Immature Grans (Abs): 0 10*3/uL (ref 0.0–0.1)
Immature Granulocytes: 0 %
Lymphocytes Absolute: 1.8 10*3/uL (ref 0.7–3.1)
Lymphs: 17 %
MCH: 32.1 pg (ref 26.6–33.0)
MCHC: 33.3 g/dL (ref 31.5–35.7)
MCV: 96 fL (ref 79–97)
Monocytes Absolute: 0.5 10*3/uL (ref 0.1–0.9)
Monocytes: 5 %
Neutrophils Absolute: 7.8 10*3/uL — ABNORMAL HIGH (ref 1.4–7.0)
Neutrophils: 77 %
Platelets: 248 10*3/uL (ref 150–450)
RBC: 3.64 x10E6/uL — ABNORMAL LOW (ref 3.77–5.28)
RDW: 12.5 % (ref 11.7–15.4)
RPR Ser Ql: NONREACTIVE
Rh Factor: POSITIVE
Rubella Antibodies, IGG: 10.1 index (ref >0.99)
WBC: 10.2 10*3/uL (ref 3.4–10.8)

## 2019-12-30 LAB — HEMOGLOBPATHY+FER W/A THAL RFX
Ferritin: 74 ng/mL (ref 15–150)
Hgb A2: 2.9 % (ref 1.8–3.2)
Hgb A: 54.5 % — ABNORMAL LOW (ref 96.4–98.8)
Hgb F: 3 % — ABNORMAL HIGH (ref 0.0–2.0)
Hgb S: 39.6 % — ABNORMAL HIGH

## 2019-12-30 LAB — CYTOLOGY - PAP
Chlamydia: NEGATIVE
Comment: NEGATIVE
Comment: NEGATIVE
Comment: NORMAL
Diagnosis: NEGATIVE
High risk HPV: NEGATIVE
Neisseria Gonorrhea: NEGATIVE

## 2019-12-30 LAB — HEPATITIS C ANTIBODY: Hep C Virus Ab: <0.1 s/co ratio (ref 0.0–0.9)

## 2019-12-30 LAB — HGB SOLUBILITY: Hgb Solubility: POSITIVE — AB

## 2019-12-30 NOTE — Telephone Encounter (Signed)
Called pt to see if the spotting stopped. Pt called the after hours nurse line over the weekend stating she had some light spotting and discharge but no pain over the weekend.Pt states she went to work today and has not spotted at all. Explained to pt that some light spotting can be normal and advised pt to go to [facility] if she starts to bleed heavy like a period. Understanding was voiced.  Sandra Gregory l Sandra Gregory, CMA

## 2020-01-09 ENCOUNTER — Inpatient Hospital Stay (HOSPITAL_COMMUNITY)
Admission: AD | Admit: 2020-01-09 | Discharge: 2020-01-09 | Disposition: A | Discharge status: Home / Self Care | Payer: 59 | Attending: Family Medicine | Admitting: Family Medicine

## 2020-01-09 ENCOUNTER — Telehealth: Payer: Self-pay

## 2020-01-09 ENCOUNTER — Other Ambulatory Visit: Payer: Self-pay

## 2020-01-09 ENCOUNTER — Inpatient Hospital Stay (HOSPITAL_COMMUNITY): Payer: 59

## 2020-01-09 ENCOUNTER — Encounter (HOSPITAL_COMMUNITY): Payer: Self-pay | Admitting: Family Medicine

## 2020-01-09 DIAGNOSIS — N939 Abnormal uterine and vaginal bleeding, unspecified: Secondary | ICD-10-CM | POA: Diagnosis not present

## 2020-01-09 DIAGNOSIS — O209 Hemorrhage in early pregnancy, unspecified: Secondary | ICD-10-CM | POA: Insufficient documentation

## 2020-01-09 DIAGNOSIS — Z87891 Personal history of nicotine dependence: Secondary | ICD-10-CM | POA: Insufficient documentation

## 2020-01-09 DIAGNOSIS — O26891 Other specified pregnancy related conditions, first trimester: Secondary | ICD-10-CM | POA: Diagnosis not present

## 2020-01-09 DIAGNOSIS — Z3A10 10 weeks gestation of pregnancy: Secondary | ICD-10-CM | POA: Diagnosis not present

## 2020-01-09 DIAGNOSIS — Z3A11 11 weeks gestation of pregnancy: Secondary | ICD-10-CM | POA: Diagnosis not present

## 2020-01-09 DIAGNOSIS — O468X9 Other antepartum hemorrhage, unspecified trimester: Secondary | ICD-10-CM

## 2020-01-09 DIAGNOSIS — O418X90 Other specified disorders of amniotic fluid and membranes, unspecified trimester, not applicable or unspecified: Secondary | ICD-10-CM

## 2020-01-09 LAB — CBC
HCT: 30.4 % — ABNORMAL LOW (ref 36.0–46.0)
Hemoglobin: 10.7 g/dL — ABNORMAL LOW (ref 12.0–15.0)
MCH: 32.1 pg (ref 26.0–34.0)
MCHC: 35.2 g/dL (ref 30.0–36.0)
MCV: 91.3 fL (ref 80.0–100.0)
Platelets: 201 K/uL (ref 150–400)
RBC: 3.33 MIL/uL — ABNORMAL LOW (ref 3.87–5.11)
RDW: 12.7 % (ref 11.5–15.5)
WBC: 9.3 K/uL (ref 4.0–10.5)
nRBC: 0 % (ref 0.0–0.2)

## 2020-01-09 LAB — URINALYSIS, ROUTINE W REFLEX MICROSCOPIC
Bilirubin Urine: NEGATIVE
Glucose, UA: NEGATIVE mg/dL
Ketones, ur: 20 mg/dL — AB
Leukocytes,Ua: NEGATIVE
Nitrite: NEGATIVE
Protein, ur: NEGATIVE mg/dL
RBC / HPF: >50 RBC/hpf — ABNORMAL HIGH (ref 0–5)
Specific Gravity, Urine: 1.013 (ref 1.005–1.030)
pH: 5 (ref 5.0–8.0)

## 2020-01-09 NOTE — MAU Provider Note (Signed)
Patient Sandra Gregory is a 34 y.o. G2P1001  at [redacted]w[redacted]d here with complaints of vaginal bleeding that started this morning.  She denies abdominal pain, cramping. She reports that she initially came to [facility]U on 12/24/2019 with abdominal pain; after that visit she reported some vaginal bleeding. Was told it was most likely due to the fibroids degenerating and the multiple speculum exams.   She stopped bleeding "for several days" and then started back up again today.    History     CSN: [id number]  Arrival date and time: 01/09/20 1349   None     Chief Complaint  Patient presents with  . Vaginal Bleeding   Vaginal Bleeding The patient's primary symptoms include vaginal bleeding. This is a new problem. The current episode started today. Pertinent negatives include no abdominal pain, diarrhea or nausea. The vaginal discharge was bloody. She has not been passing clots. She has not been passing tissue. Exacerbated by: increased with movement.    OB History    Gravida  2   Para  1   Term  1   Preterm  0   AB  0   Living  1     SAB  0   TAB  0   Ectopic  0   Multiple  0   Live Births  1           Past Medical History:  Diagnosis Date  . Anemia   . Anxiety   . Depression     Past Surgical History:  Procedure Laterality Date  . NO PAST SURGERIES      Family History  Problem Relation Age of Onset  . Cancer Maternal Grandmother        breast  . Diabetes Maternal Grandmother   . Hypertension Maternal Grandmother   . Diabetes Maternal Grandfather   . Hypertension Maternal Grandfather     Social History   Tobacco Use  . Smoking status: Former Smoker    Types: Cigarettes    Quit date: 11/22/2019    Years since quitting: 0.1  . Smokeless tobacco: Never Used  Substance Use Topics  . Alcohol use: Not Currently  . Drug use: Not Currently    Types: Marijuana    Comment: not since pregnancy    Allergies: No Known Allergies  Medications Prior to Admission   Medication Sig Dispense Refill Last Dose  . Prenatal Vit-Fe Fumarate-FA (PRENATAL MULTIVITAMIN) TABS tablet Take 1 tablet by mouth daily at 12 noon.    01/08/2020 at Unknown time  . acetaminophen (TYLENOL) 325 MG tablet Take 650 mg by mouth every 6 (six) hours as needed.   Unknown at Unknown time  . diphenhydrAMINE (BENADRYL) 25 MG tablet Take 50 mg by mouth every 6 (six) hours as needed for allergies.   Unknown at Unknown time  . traMADol (ULTRAM) 50 MG tablet Take 1 tablet (50 mg total) by mouth every 6 (six) hours as needed for severe pain. (Patient not taking: Reported on 12/27/2019) 20 tablet 2     Review of Systems  Constitutional: Negative.   HENT: Negative.   Gastrointestinal: Negative.  Negative for abdominal pain, diarrhea and nausea.  Genitourinary: Positive for vaginal bleeding.  Neurological: Negative.   Psychiatric/Behavioral: Negative.    Physical Exam   Blood pressure 118/72, pulse 76, temperature 98 F (36.7 C), resp. rate 16, weight 88 kg, last menstrual period 10/25/2019, SpO2 100 %.  Physical Exam  Constitutional: She appears well-developed.  HENT:  Head: Normocephalic.  Respiratory: Effort normal.  GI: Soft.  Genitourinary:    Genitourinary Comments: NEFG; dark brown blood in the vagina, no clots, cervix not visualized. No adnexal or suprapubic tenderness.    Musculoskeletal:        General: Normal range of motion.     Cervical back: Normal range of motion.  Neurological: She is alert.  Skin: Skin is warm.    MAU Course  Procedures  MDM CBC stable;  Bleeding tapered off while in MAU.  -US shows viable IUP with large subchorionic hemorrhage and multiple fibroids, no other acute findings.   Assessment and Plan    1.  Large Subchorionic hemorrhage 2. Patient stable for discharge with bleeding and pain return precautions; plan to expect bleeding but return to MAU if it becomes significantly heavier, becomes bright red or with clots, or pain increases.  Patient and wife verbalized understanding; she will keep follow up appt and try to rest at home as much as possible.     Mervyn Skeeters Santasia Rew 01/09/2020, 3:00 PM

## 2020-01-09 NOTE — MAU Note (Signed)
.   Sandra Gregory is a 34 y.o. at [redacted]w[redacted]d here in MAU reporting: vaginal bleeding running down her legs while she was at work. Denies any pain, reports that she does have fibroids LMP:10/25/19 Onset of complaint: today Pain score: 0 Vitals:   01/09/20 1413  BP: 118/72  Pulse: 76  Resp: 16  Temp: 98 F (36.7 C)  SpO2: 100%     FHT: Lab orders placed from triage: [facility]

## 2020-01-09 NOTE — Discharge Instructions (Signed)

## 2020-01-09 NOTE — Telephone Encounter (Signed)
Patient called stating she was at work and has a big gush of blood run down her legs. Patient is 10w 6 days. Patient states that she is still having a good amount of bleeding now. Patient upset on the phone. Patient is in [place] and advised to go to [facility] at [facility] for evaluation. Patient states her partner can drive her there. Kathrene Alu RN

## 2020-01-24 ENCOUNTER — Encounter: Payer: Self-pay | Admitting: Obstetrics & Gynecology

## 2020-01-24 ENCOUNTER — Ambulatory Visit (HOSPITAL_COMMUNITY): Payer: 59 | Attending: Obstetrics and Gynecology

## 2020-01-24 ENCOUNTER — Ambulatory Visit (INDEPENDENT_AMBULATORY_CARE_PROVIDER_SITE_OTHER): Payer: 59 | Admitting: Obstetrics & Gynecology

## 2020-01-24 ENCOUNTER — Ambulatory Visit: Payer: 59 | Admitting: *Deleted

## 2020-01-24 ENCOUNTER — Other Ambulatory Visit: Payer: Self-pay | Admitting: *Deleted

## 2020-01-24 ENCOUNTER — Other Ambulatory Visit: Payer: Self-pay

## 2020-01-24 VITALS — BP 110/70 | HR 89 | Wt 198.0 lb

## 2020-01-24 DIAGNOSIS — Z3480 Encounter for supervision of other normal pregnancy, unspecified trimester: Secondary | ICD-10-CM | POA: Insufficient documentation

## 2020-01-24 DIAGNOSIS — O418X10 Other specified disorders of amniotic fluid and membranes, first trimester, not applicable or unspecified: Secondary | ICD-10-CM

## 2020-01-24 DIAGNOSIS — O3411 Maternal care for benign tumor of corpus uteri, first trimester: Secondary | ICD-10-CM | POA: Insufficient documentation

## 2020-01-24 DIAGNOSIS — O468X1 Other antepartum hemorrhage, first trimester: Secondary | ICD-10-CM

## 2020-01-24 DIAGNOSIS — D259 Leiomyoma of uterus, unspecified: Secondary | ICD-10-CM | POA: Insufficient documentation

## 2020-01-24 DIAGNOSIS — O4691 Antepartum hemorrhage, unspecified, first trimester: Secondary | ICD-10-CM

## 2020-01-24 DIAGNOSIS — O99211 Obesity complicating pregnancy, first trimester: Secondary | ICD-10-CM

## 2020-01-24 DIAGNOSIS — O3410 Maternal care for benign tumor of corpus uteri, unspecified trimester: Secondary | ICD-10-CM | POA: Diagnosis present

## 2020-01-24 DIAGNOSIS — O99011 Anemia complicating pregnancy, first trimester: Secondary | ICD-10-CM

## 2020-01-24 DIAGNOSIS — Z3481 Encounter for supervision of other normal pregnancy, first trimester: Secondary | ICD-10-CM

## 2020-01-24 DIAGNOSIS — D573 Sickle-cell trait: Secondary | ICD-10-CM

## 2020-01-24 DIAGNOSIS — O99210 Obesity complicating pregnancy, unspecified trimester: Secondary | ICD-10-CM

## 2020-01-24 DIAGNOSIS — Z3A13 13 weeks gestation of pregnancy: Secondary | ICD-10-CM

## 2020-01-24 HISTORY — DX: Sickle-cell trait: D57.3

## 2020-01-24 MED ORDER — POLYSACCHARIDE IRON COMPLEX 150 MG PO CAPS: 150.0000 mg | ORAL_CAPSULE | Freq: Every day | ORAL | 3 refills | Status: AC

## 2020-01-24 MED ORDER — DOCUSATE SODIUM 100 MG PO CAPS: 100.0000 mg | ORAL_CAPSULE | Freq: Two times a day (BID) | ORAL | 2 refills | Status: DC | PRN

## 2020-01-24 NOTE — Progress Notes (Signed)
PRENATAL VISIT NOTE  Subjective:  Sandra Gregory is a 34 y.o. G2P1001 at [redacted]w[redacted]d being seen today for ongoing prenatal care.  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  Patient reports no obstetric complaints.  Contractions: Not present. Vag. Bleeding: Scant.  Movement: Absent. Denies leaking of fluid.   The following portions of the patient's history were reviewed and updated as appropriate: allergies, current medications, past family history, past medical history, past social history, past surgical history and problem list.   Objective:   Vitals:   01/24/20 0859  BP: 110/70  Pulse: 89  Weight: 198 lb (89.8 kg)    Fetal Status: Fetal Heart Rate (bpm): 166   Movement: Absent     General:  Alert, oriented and cooperative. Patient is in no acute distress.  Skin: Skin is warm and dry. No rash noted.   Cardiovascular: Normal heart rate noted  Respiratory: Normal respiratory effort, no problems with respiration noted  Abdomen: Soft, gravid, appropriate for gestational age.  Pain/Pressure: Present     Pelvic: Cervical exam deferred        Extremities: Normal range of motion.  Edema: None  Mental Status: Normal mood and affect. Normal behavior. Normal judgment and thought content.   Imaging: US OB Comp Less 14 Wks  Result Date: 01/09/2020 CLINICAL DATA:  Vaginal bleeding, positive urine pregnancy test EXAM: OBSTETRIC <14 WK ULTRASOUND TECHNIQUE: Transabdominal ultrasound was performed for evaluation of the gestation as well as the maternal uterus and adnexal regions. COMPARISON:  12/27/2019 FINDINGS: Intrauterine gestational sac: Single Yolk sac:  Not Visualized. Embryo:  Visualized. Cardiac Activity: Visualized. Heart Rate: 166 bpm CRL:   41.3 mm   11 w 0 d                   [place] EDC: 07/30/2020 Subchorionic hemorrhage: There is significant subchorionic hemorrhage measuring 5.7 x 3.7 x 4.5 cm along the right inferior aspect of the gestational sac. Maternal uterus/adnexae: Uterus is enlarged and heterogeneous with multiple fibroids, largest measuring 9.3 cm posteriorly and 9.9 cm along the left fundal aspect. Right ovary measures 4.1 x 1.4 x 2.5 cm and the left ovary measures 4.5 x 2.6 x 2.9 cm. No adnexal masses or free fluid. IMPRESSION: 1. Single live intrauterine pregnancy as above, estimated age 58 weeks and 0 days. 2. Moderate to large subchorionic hemorrhage as above. 3. Numerous uterine fibroids. Electronically Signed   By: Randa Ngo M.D.   On: 01/09/2020 16:02   [place] MFM OB Comp Less 14 Wks  Result Date: 12/27/2019 ----------------------------------------------------------------------  OBSTETRICS REPORT                       (Signed Final 12/27/2019 03:31 pm) ---------------------------------------------------------------------- Patient Info  ID #:       [id number]                          D.O.B.:  08/01/1986 (33 yrs)  Name:       Sandra Gregory                    Visit Date: 12/27/2019 07:55 am ---------------------------------------------------------------------- Performed By  Performed By:     Jeanene Erb BS,      Ref. Address:  [address] [place],                                                             [place] [address]  Attending:        Tama High MD        [facility]  Referred By:      Lavonia Drafts MD ---------------------------------------------------------------------- Orders   #  Description                          Code         Ordered By   1  [place] MFM OB COMP LESS THAN             76801.4      CAROLYN      14 WEEKS                                           HARRAWAY-SMITH  ----------------------------------------------------------------------   #  Order #                    Accession #                 Episode #   1  [id number]                  [id number]                  [id number]  ---------------------------------------------------------------------- Indications   Vaginal bleeding in pregnancy, first trimester O46.91   Uterine fibroids affecting pregnancy in first  O34.11, D25.9   trimester, antepartum   [redacted] weeks gestation of pregnancy                 Z3A.09  ---------------------------------------------------------------------- Fetal Evaluation  Num Of Fetuses:         1  Preg. Location:         Intrauterine  Gest. Sac:              Intrauterine, small subchorionic bleed  Yolk Sac:               Visualized  Fetal Pole:             Visualized  Fetal Heart Rate(bpm):  180  Cardiac Activity:       Observed  Amniotic Fluid  AFI FV:      Within normal limits ---------------------------------------------------------------------- Biometry  CRL:     23.16  mm     G. Age:  8w 6d                   EDD:   08/01/20 ---------------------------------------------------------------------- OB History  Gravidity:    2         Term:   1        Prem:   0        SAB:   0  TOP:          0       Ectopic:  0        Living: 1 ---------------------------------------------------------------------- Gestational Age  LMP:           9w 0d         Date:  10/25/19                 EDD:   07/31/20  Best:          Donzetta Starch 0d      Det. By:  LMP  (10/25/19)          EDD:   07/31/20 ---------------------------------------------------------------------- Cervix Uterus Adnexa  Cervix  Visualized anteriorly displaced by large posterior fibroid  Left Ovary  Within normal limits.  Right Ovary  Not visualized.  Cul De Sac  Small amount of free fluid seen.  Adnexa  No abnormality visualized. ---------------------------------------------------------------------- Myomas   Site                      L(cm)      W(cm)      D(cm)      Location   Posterior LUS/CX         8.4        7.6        8.7   Fundus                   7.9        6.7        9.6   Right                    7.7        6.6        5.8  ----------------------------------------------------------------------   Blood Flow                 RI        PI       Comments  ---------------------------------------------------------------------- Impression  Ms. Radler, G2 P1, here for transvaginal evaluation cervical  length.  Cervix could not be assessed on clinical examination  because of the presence of multiple myomas.  Patient also  gives history of slight vaginal bleeding in this pregnancy.  MRI was performed 4 days ago for complaints of abdominal  pain and it showed numerous large fibroids and no evidence  of acute appendicitis.  Obstetric history significant for a term vaginal delivery 13  years ago of a female infant.  Uncomplicated pregnancy and  delivery.  Patient reports no chronic medical conditions.  Patient  conceived by donor insemination.  We performed a transabdominal and transvaginal ultrasound.  Fetal evaluation was better on transabdominal evaluation.  The CRL measurement is consistent with the previously  established dates and good fetal heart activity seen.  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  A small ill-defined subchorionic hematoma was seen.  On transvaginal ultrasound, the cervix could not be evaluated  because of its compression and distortion by the posterior  myoma.  The probe cover was bloodstained.  I explained the findings and reassured her that given that she  had a term vaginal delivery, the likelihood of preterm delivery  is low.  However, myoma is due to increase the risk of  preterm deliveries.  I also reassured her that if vaginal bleeding stops the  likelihood of successful pregnancy outcome is greater. ----------------------------------------------------------------------  Recommendations  -An appointment was made for her to return in 4 weeks for  evaluation of her pregnancy.  -Cervical length measurement at 16 weeks.  -Fetal anatomy scan at 18 to 20 weeks. ----------------------------------------------------------------------                  Tama High, MD Electronically Signed Final Report   12/27/2019 03:31 pm ----------------------------------------------------------------------   Assessment and Plan:  Pregnancy: G2P1001 at [redacted]w[redacted]d 1. Uterine fibroids affecting pregnancy, antepartum Resolved pain for now, no medications.  Discussed likely recurrence of pain during pregnancy. - [place] MFM OB DETAIL +14 WK; Future  2. Hemoglobin A-S genotype [facility]) Patient informed. As she used a sperm donor, it is not possible to do FOB testing.  She was assured that newborn screening will be done.  3. Anemia affecting pregnancy in first trimester CBC Latest Ref Rng & Units 01/09/2020 12/26/2019 12/23/2019  WBC 4.0 - 10.5 K/uL 9.3 10.2 10.0  Hemoglobin 12.0 - 15.0 g/dL 10.7(L) 11.7 11.3(L)  Hematocrit 36.0 - 46.0 % 30.4(L) 35.1 32.6(L)  Platelets 150 - 400 K/uL 201 248 226  Hgb 10.7. Oral iron therapy prescribed for patient. Advised to take at same time as prenatal vitamin as this helps with iron absorption. Colace also prescribed as needed for constipation. - iron polysaccharides (NIFEREX) 150 MG capsule; Take 1 capsule (150 mg total) by mouth daily.  Dispense: 30 capsule; Refill: 3 - docusate sodium (COLACE) 100 MG capsule; Take 1 capsule (100 mg total) by mouth 2 (two) times daily as needed for mild constipation or moderate constipation.  Dispense: 30 capsule; Refill: 2  4. Subchorionic hemorrhage of placenta in first trimester, single or unspecified fetus No current bright red bleeding. Precautions reviewed  5. Obesity in pregnancy, antepartum TWG 12 lb, continue to monitor.  - [place] MFM OB DETAIL +14 WK; Future  6. Encounter for supervision of other normal pregnancy in first  trimester Reviewed all other OB panel labs.  All questions answered. NT scan/First trimester scan today at MFM, NIPS to be drawn here today. Anatomy scan ordered. Counseled about AFP screening, will get at next visit.  - Genetic screening - [place] MFM OB DETAIL +14 WK; Future No other complaints or concerns.  Routine obstetric precautions reviewed.  Please refer to After Visit Summary for other counseling recommendations.   Return in about 4 weeks (around 02/21/2020) for OFFICE OB Visit and AFP only screen.  Future Appointments  Date Time Provider [place]  01/24/2020 10:30 AM [facility] NURSE [facility]  01/24/2020 10:30 AM [facility] US1 [facility]  02/21/2020  9:45 AM Lavonia Drafts, MD [facility] None  Verita Schneiders, MD

## 2020-01-24 NOTE — Patient Instructions (Signed)

## 2020-02-11 ENCOUNTER — Encounter (HOSPITAL_COMMUNITY): Payer: Self-pay | Admitting: Obstetrics and Gynecology

## 2020-02-11 ENCOUNTER — Inpatient Hospital Stay (HOSPITAL_COMMUNITY): Payer: 59

## 2020-02-11 ENCOUNTER — Inpatient Hospital Stay (HOSPITAL_COMMUNITY)
Admission: AD | Admit: 2020-02-11 | Discharge: 2020-02-11 | Disposition: A | Discharge status: Home / Self Care | Payer: 59 | Attending: Obstetrics and Gynecology | Admitting: Obstetrics and Gynecology

## 2020-02-11 DIAGNOSIS — Z8249 Family history of ischemic heart disease and other diseases of the circulatory system: Secondary | ICD-10-CM | POA: Insufficient documentation

## 2020-02-11 DIAGNOSIS — Z833 Family history of diabetes mellitus: Secondary | ICD-10-CM | POA: Insufficient documentation

## 2020-02-11 DIAGNOSIS — R109 Unspecified abdominal pain: Secondary | ICD-10-CM | POA: Diagnosis present

## 2020-02-11 DIAGNOSIS — Z87891 Personal history of nicotine dependence: Secondary | ICD-10-CM | POA: Diagnosis not present

## 2020-02-11 DIAGNOSIS — O039 Complete or unspecified spontaneous abortion without complication: Secondary | ICD-10-CM | POA: Insufficient documentation

## 2020-02-11 LAB — CBC
HCT: 28.7 % — ABNORMAL LOW (ref 36.0–46.0)
Hemoglobin: 9.8 g/dL — ABNORMAL LOW (ref 12.0–15.0)
MCH: 31.4 pg (ref 26.0–34.0)
MCHC: 34.1 g/dL (ref 30.0–36.0)
MCV: 92 fL (ref 80.0–100.0)
Platelets: 213 10*3/uL (ref 150–400)
RBC: 3.12 MIL/uL — ABNORMAL LOW (ref 3.87–5.11)
RDW: 11.9 % (ref 11.5–15.5)
WBC: 9.7 10*3/uL (ref 4.0–10.5)
nRBC: 0 % (ref 0.0–0.2)

## 2020-02-11 LAB — COMPREHENSIVE METABOLIC PANEL
ALT: 9 U/L (ref 0–44)
AST: 14 U/L — ABNORMAL LOW (ref 15–41)
Albumin: 3 g/dL — ABNORMAL LOW (ref 3.5–5.0)
Alkaline Phosphatase: 60 U/L (ref 38–126)
Anion gap: 13 (ref 5–15)
BUN: 7 mg/dL (ref 6–20)
CO2: 23 mmol/L (ref 22–32)
Calcium: 9.3 mg/dL (ref 8.9–10.3)
Chloride: 100 mmol/L (ref 98–111)
Creatinine, Ser: 0.79 mg/dL (ref 0.44–1.00)
GFR calc Af Amer: >60 mL/min (ref >60)
GFR calc non Af Amer: >60 mL/min (ref >60)
Glucose, Bld: 76 mg/dL (ref 70–99)
Potassium: 3.4 mmol/L — ABNORMAL LOW (ref 3.5–5.1)
Sodium: 136 mmol/L (ref 135–145)
Total Bilirubin: 0.6 mg/dL (ref 0.3–1.2)
Total Protein: 7 g/dL (ref 6.5–8.1)

## 2020-02-11 NOTE — MAU Provider Note (Signed)
History     CSN: [id number]  Arrival date and time: 02/11/20 [id number]  First Provider Initiated Contact with Patient 02/11/20 [phone]     Chief Complaint  Patient presents with  . Abdominal Pain  . Miscarriage   HPI Sandra Gregory is a 34 y.o. G2P1001 at [redacted]w[redacted]d who presents to MAU via EMS following spontaneous miscarriage at home. Patient states she woke up around 0645 this morning, noted new onset perineal pressure, walked to the bathroom to void and delivered her baby in the commode. She denies heavy vaginal bleeding, abdominal tenderness, weakness, dizziness or syncope. She does not believe she has delivered her placenta. She is very tearful and upset and is accompanied by her wife Vevelyn Royals.  OB History    Gravida  2   Para  1   Term  1   Preterm  0   AB  0   Living  1     SAB  0   TAB  0   Ectopic  0   Multiple  0   Live Births  1           Past Medical History:  Diagnosis Date  . Anemia   . Anxiety   . Depression   . 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 NO PAST SURGERIES      Family History  Problem Relation Age of Onset  . Cancer Maternal Grandmother        breast  . Diabetes Maternal Grandmother   . Hypertension Maternal Grandmother   . Diabetes Maternal Grandfather   . Hypertension Maternal Grandfather     Social History   Tobacco Use  . Smoking status: Former Smoker    Types: Cigarettes    Quit date: 11/22/2019    Years since quitting: 0.2  . Smokeless tobacco: Never Used  Substance Use Topics  . Alcohol use: Not Currently  . Drug use: Not Currently    Types: Marijuana    Comment: not since pregnancy    Allergies: No Known Allergies  Medications Prior to Admission  Medication Sig Dispense Refill Last Dose  . acetaminophen (TYLENOL) 325 MG tablet Take 650 mg by mouth every 6 (six) hours as needed.   02/10/2020 at Unknown time  . docusate sodium (COLACE) 100 MG capsule Take 1  capsule (100 mg total) by mouth 2 (two) times daily as needed for mild constipation or moderate constipation. 30 capsule 2 Past Week at Unknown time  . Prenatal Vit-Fe Fumarate-FA (PRENATAL MULTIVITAMIN) TABS tablet Take 1 tablet by mouth daily at 12 noon.    02/10/2020 at Unknown time  . traMADol (ULTRAM) 50 MG tablet Take 1 tablet (50 mg total) by mouth every 6 (six) hours as needed for severe pain. 20 tablet 2 Past Week at Unknown time  . diphenhydrAMINE (BENADRYL) 25 MG tablet Take 50 mg by mouth every 6 (six) hours as needed for allergies.     Marland Kitchen iron polysaccharides (NIFEREX) 150 MG capsule Take 1 capsule (150 mg total) by mouth daily. 30 capsule 3     Review of Systems  Constitutional: Negative for chills, fatigue and fever.  Respiratory: Negative for shortness of breath.   Gastrointestinal: Positive for abdominal pain.  Genitourinary: Positive for vaginal bleeding.  Musculoskeletal: Negative for back pain.  Neurological: Negative for dizziness, syncope, weakness and light-headedness.  All other systems reviewed and are negative.  Physical Exam   Blood pressure 126/71, pulse 98,  temperature 98.5 F (36.9 C), temperature source Oral, resp. rate 18, last menstrual period 10/25/2019, SpO2 100 %.  Physical Exam  Nursing note and vitals reviewed. Constitutional: She is oriented to person, place, and time. She appears well-developed and well-nourished.  Cardiovascular: Normal rate, normal heart sounds and intact distal pulses.  Respiratory: Effort normal and breath sounds normal.  GI: Soft. Bowel sounds are normal. She exhibits no distension. There is no abdominal tenderness. There is no rebound and no guarding.  Genitourinary:    Genitourinary Comments: Scant bleeding on SSE. 6cm discrete collection of tissue in vagina. Suspicious for placenta. Gently removed with fox swab.    Neurological: She is alert and oriented to person, place, and time.  Skin: Skin is warm and dry.  Psychiatric:  She has a normal mood and affect. Her behavior is normal. Judgment and thought content normal.    MAU Course/MDM  Procedures  --S/p 4 hour observation in MAU. Vital signs stable, scant bleeding, intermittent abdominal cramping, pain score ranging from 1-3/10. Patient endorses "feeling much better", declines pain medicine. --Chaplain at bedside --Plan for outpatient follow-up at [facility] in one week. Patient declines offer to send staff message, patient will call to schedule --Signs of worsening acuity, indications for return to MAU reviewed with patient and wife prior to d/c --ROS, assessment, results reviewed with Dr. Rosana Hoes, who agrees with my plan of care  Patient Vitals for the past 24 hrs:  BP Temp Temp src Pulse Resp SpO2  02/11/20 1149 122/73 -- -- 95 16 --  02/11/20 1025 113/61 98 F (36.7 C) Oral 94 18 100 %  02/11/20 0807 126/71 98.5 F (36.9 C) Oral 98 18 100 %   Results for orders placed or performed during the hospital encounter of 02/11/20 (from the past 24 hour(s))  CBC     Status: Abnormal   Collection Time: 02/11/20  9:33 AM  Result Value Ref Range   WBC 9.7 4.0 - 10.5 K/uL   RBC 3.12 (L) 3.87 - 5.11 MIL/uL   Hemoglobin 9.8 (L) 12.0 - 15.0 g/dL   HCT 28.7 (L) 36.0 - 46.0 %   MCV 92.0 80.0 - 100.0 fL   MCH 31.4 26.0 - 34.0 pg   MCHC 34.1 30.0 - 36.0 g/dL   RDW 11.9 11.5 - 15.5 %   Platelets 213 150 - 400 K/uL   nRBC 0.0 0.0 - 0.2 %  Comprehensive metabolic panel     Status: Abnormal   Collection Time: 02/11/20  9:33 AM  Result Value Ref Range   Sodium 136 135 - 145 mmol/L   Potassium 3.4 (L) 3.5 - 5.1 mmol/L   Chloride 100 98 - 111 mmol/L   CO2 23 22 - 32 mmol/L   Glucose, Bld 76 70 - 99 mg/dL   BUN 7 6 - 20 mg/dL   Creatinine, Ser 0.79 0.44 - 1.00 mg/dL   Calcium 9.3 8.9 - 10.3 mg/dL   Total Protein 7.0 6.5 - 8.1 g/dL   Albumin 3.0 (L) 3.5 - 5.0 g/dL   AST 14 (L) 15 - 41 U/L   ALT 9 0 - 44 U/L   Alkaline Phosphatase 60 38 - 126 U/L   Total Bilirubin 0.6  0.3 - 1.2 mg/dL   GFR calc non Af Amer >60 >60 mL/min   GFR calc Af Amer >60 >60 mL/min   Anion gap 13 5 - 15   US OB Transvaginal  Result Date: 02/11/2020 CLINICAL DATA:  Status post spontaneous miscarriage.  EXAM: TRANSVAGINAL OB ULTRASOUND TECHNIQUE: Transvaginal ultrasound was performed for complete evaluation of the gestation as well as the maternal uterus, adnexal regions, and pelvic cul-de-sac. COMPARISON:  Jan 24, 2020.  January 09, 2020. FINDINGS: Maternal uterus/adnexae: Enlarged fibroid uterus is again noted. Due to the fibroids, the endometrium could not be clearly visualized. No definite intrauterine fluid collection or gestational sac is noted. Due to the fibroids, the presence or absence of retained products of conception cannot be determined. IMPRESSION: Enlarged fibroid uterus is again noted. Due to the fibroids, the endometrium could not be clearly visualized, and therefore the absence or presence of retained products of conception cannot be determined on this exam. Electronically Signed   By: Marijo Conception M.D.   On: 02/11/2020 10:08   Assessment and Plan  --34 y.o. G2P1001 s/p spontaneous miscarriage at 15w --Stable throughout 4 hours evaluation in MAU --Discharge home in stable condition  F/U: --[facility] in one week  Darlina Rumpf, CNM 02/11/2020, 2:02 PM

## 2020-02-11 NOTE — Discharge Instructions (Signed)
Miscarriage A miscarriage is the loss of an unborn baby (fetus) before the 20th week of pregnancy. Follow these instructions at home: Medicines   Take over-the-counter and prescription medicines only as told by your doctor.  If you were prescribed antibiotic medicine, take it as told by your doctor. Do not stop taking the antibiotic even if you start to feel better.  Do not take NSAIDs unless your doctor says that this is safe for you. NSAIDs include aspirin and ibuprofen. These medicines can cause bleeding. Activity  Rest as directed. Ask your doctor what activities are safe for you.  Have someone help you at home during this time. General instructions  Write down how many pads you use each day and how soaked they are.  Watch the amount of tissue or clumps of blood (blood clots) that you pass from your vagina. Save any large amounts of tissue for your doctor.  Do not use tampons, douche, or have sex until your doctor approves.  To help you and your partner with the process of grieving, talk with your doctor or seek counseling.  When you are ready, meet with your doctor to talk about steps you should take for your health. Also, talk with your doctor about steps to take to have a healthy pregnancy in the future.  Keep all follow-up visits as told by your doctor. This is important. Contact a doctor if:  You have a fever or chills.  You have vaginal discharge that smells bad.  You have more bleeding. Get help right away if:  You have very bad cramps or pain in your back or belly.  You pass clumps of blood that are walnut-sized or larger from your vagina.  You pass tissue that is walnut-sized or larger from your vagina.  You soak more than 1 regular pad in an hour.  You get light-headed or weak.  You faint (pass out).  You have feelings of sadness that do not go away, or you have thoughts of hurting yourself. Summary  A miscarriage is the loss of an unborn baby before  the 20th week of pregnancy.  Follow your doctor's instructions for home care. Keep all follow-up appointments.  To help you and your partner with the process of grieving, talk with your doctor or seek counseling. This information is not intended to replace advice given to you by your health care provider. Make sure you discuss any questions you have with your health care provider. Document Revised: 12/28/2018 Document Reviewed: 10/11/2016 Elsevier Patient Education  2020 [employer]  

## 2020-02-11 NOTE — MAU Note (Signed)
Pt presents to MAU via EMS, she reports that she miscarried this morning around 0645. She was approximately 15 weeks. She is having mild sharp abdominal pain and light bleeding.

## 2020-02-12 ENCOUNTER — Telehealth: Payer: Self-pay

## 2020-02-12 NOTE — Telephone Encounter (Signed)
Called patient to check on her- left message she can return call to office. Patient had SAB yesterday at home and was seen in MAU.  Kathrene Alu RN

## 2020-02-13 ENCOUNTER — Ambulatory Visit: Payer: 59

## 2020-02-13 LAB — SURGICAL PATHOLOGY

## 2020-02-21 ENCOUNTER — Other Ambulatory Visit: Payer: Self-pay

## 2020-02-21 ENCOUNTER — Encounter: Payer: Self-pay | Admitting: Obstetrics & Gynecology

## 2020-02-21 ENCOUNTER — Ambulatory Visit (INDEPENDENT_AMBULATORY_CARE_PROVIDER_SITE_OTHER): Payer: 59 | Admitting: Obstetrics & Gynecology

## 2020-02-21 VITALS — BP 119/83 | HR 118 | Wt 187.0 lb

## 2020-02-21 DIAGNOSIS — N883 Incompetence of cervix uteri: Secondary | ICD-10-CM | POA: Diagnosis not present

## 2020-02-21 DIAGNOSIS — F17200 Nicotine dependence, unspecified, uncomplicated: Secondary | ICD-10-CM

## 2020-02-21 DIAGNOSIS — O039 Complete or unspecified spontaneous abortion without complication: Secondary | ICD-10-CM | POA: Diagnosis not present

## 2020-02-21 NOTE — Progress Notes (Signed)
History:  34 y.o. G2P1001 here today for f/u of SAB at [redacted] weeks gestation on 02/11/2020. Pt had bleeding throughout her pregnancy.  Her cervical length was WNl. Her pregnancy has been complicated with a fibroid uterus. Pt wants to do whatever she needs to for a healthy outcome on her next pregnancy. She denies pain with the passage of the fetus. She went to have a BM and saw a cord and feet. She had some bleeding post delivery but, has been fine. She reports that she has a good support system.     The following portions of the patient's history were reviewed and updated as appropriate: allergies, current medications, past family history, past medical history, past social history, past surgical history and problem list.  Review of Systems:  Pertinent items are noted in HPI.    Objective:  Physical Exam Blood pressure 119/83, pulse (!) 118, weight 187 lb (84.8 kg), last menstrual period 10/25/2019.  CONSTITUTIONAL: Well-developed, well-nourished female in no acute distress.  HENT:  Normocephalic, atraumatic EYES: Conjunctivae and EOM are normal. No scleral icterus.  NECK: Normal range of motion SKIN: Skin is warm and dry. No rash noted. Not diaphoretic.No pallor. Turner: Alert and oriented to person, place, and time. Normal coordination.  Abd: Soft, nontender and nondistended Large mass in plevis c/w uterine fiborids. FH 28 cm Pelvic: not done today  Labs and Imaging US OB Transvaginal  Result Date: 02/11/2020 CLINICAL DATA:  Status post spontaneous miscarriage. EXAM: TRANSVAGINAL OB ULTRASOUND TECHNIQUE: Transvaginal ultrasound was performed for complete evaluation of the gestation as well as the maternal uterus, adnexal regions, and pelvic cul-de-sac. COMPARISON:  Jan 24, 2020.  January 09, 2020. FINDINGS: Maternal uterus/adnexae: Enlarged fibroid uterus is again noted. Due to the fibroids, the endometrium could not be clearly visualized. No definite intrauterine fluid collection or  gestational sac is noted. Due to the fibroids, the presence or absence of retained products of conception cannot be determined. IMPRESSION: Enlarged fibroid uterus is again noted. Due to the fibroids, the endometrium could not be clearly visualized, and therefore the absence or presence of retained products of conception cannot be determined on this exam. Electronically Signed   By: Marijo Conception M.D.   On: 02/11/2020 10:08   [place] MFM OB Comp Less 14 Wks  Result Date: 01/24/2020 ----------------------------------------------------------------------  OBSTETRICS REPORT                       (Signed Final 01/24/2020 02:36 pm) ---------------------------------------------------------------------- Patient Info  ID #:       [id number]                          D.O.B.:  02/25/1986 (33 yrs)  Name:       Sandra Gregory                    Visit Date: 01/24/2020 10:34 am ---------------------------------------------------------------------- Performed By  Attending:        Johnell Comings MD         Ref. Address:     [place],  [place] [address]  Performed By:     Jacob Moores BS,       [facility], [facility]                                [facility]  Referred By:      Lavonia Drafts MD ---------------------------------------------------------------------- Orders  #  Description                           Code        Ordered By  1  [place] MFM OB COMP LESS THAN              76801.4     [facility]     14 WEEKS ----------------------------------------------------------------------  #  Order #                     Accession #                Episode #  1  [id number]                   [id number]                 [id number] ---------------------------------------------------------------------- Indications  Vaginal bleeding in  pregnancy, first trimester O46.91  [redacted] weeks gestation of pregnancy                Z3A.13  Uterine fibroids affecting pregnancy in first  O34.11, D25.9  trimester, antepartum ---------------------------------------------------------------------- Fetal Evaluation  Num Of Fetuses:         1  Preg. Location:         Intrauterine  Gest. Sac:              Intrauterine  Fetal Pole:             Visualized  Fetal Heart Rate(bpm):  162  Cardiac Activity:       Observed  Presentation:           Variable  Placenta:               Fundal  P. Cord Insertion:      Not well visualized  Amniotic Fluid  AFI FV:      Within normal limits  Comment:    Moderate subchorionic hemorrhage noted at Right uterus:              6.9 x 2.8 x 6.6cm. ---------------------------------------------------------------------- Biometry  CRL:      70.3  mm     G. Age:  13w 0d                  EDD:   07/31/20  NT:       1.27  mm ---------------------------------------------------------------------- OB History  Gravidity:    2         Term:   1        Prem:   0        SAB:  0  TOP:          0       Ectopic:  0        Living: 1 ---------------------------------------------------------------------- Gestational Age  LMP:           13w 0d        Date:  10/25/19                 EDD:   07/31/20  Best:          [place] 0d     Det. By:  LMP  (10/25/19)          EDD:   07/31/20 ---------------------------------------------------------------------- Anatomy  Cranium:               Visualized             Abdominal Wall:         Visualized  Choroid Plexus:        Visualized             Bladder:                Visualized  Heart:                 Visualized             Upper Extremities:      Visualized  Stomach:               Visualized             Lower Extremities:      Visualized  Other:  Technically difficult due to early gestational age and fibroids. ---------------------------------------------------------------------- Cervix Uterus Adnexa  Cervix  Visualized anteriorly  displaced by large posterior fibroid  Uterus  No abnormality visualized.  Right Ovary  Within normal limits. No adnexal mass visualized.  Left Ovary  Within normal limits. No adnexal mass visualized.  Cul De Sac  No free fluid seen.  Adnexa  No abnormality visualized. ---------------------------------------------------------------------- Myomas  Site                     L(cm)      W(cm)      D(cm)       Location  Posterior LUS/CX         8.7        10.3       9.9         Intramural  Fundus                   6.5        6.7        6.9         Subserosal  Right                    7.7        6.6        5.8  Left Fundus              9          7.8        11.4        Subserosal ----------------------------------------------------------------------  Blood Flow                  RI       PI       Comments  Previously measured, not                                                measured today ---------------------------------------------------------------------- Comments  This patient was seen for a first trimester ultrasound due to a  fibroid uterus and vaginal bleeding.  She reports that she  continues to have intermittent spotting.  She just had a cell free DNA test drawn today.  On today's exam, the crown-rump length measures  consistent with her gestational age.  A viable singleton  intrauterine pregnancy is noted.  Multiple fibroids continue to be noted throughout her uterus.  A subchorionic hematoma is also noted today.  The increased risk of maternal pain issues and possible fetal  growth issues due to her multiple fibroid uterus was  discussed.  The increased risk of miscarriage and possible preterm  delivery due to her persistent vaginal spotting and the  subchorionic hematoma noted today was discussed.  A follow-up exam was scheduled in 3 weeks to assess her  cervical length. ----------------------------------------------------------------------                   Johnell Comings, MD Electronically Signed Final Report   01/24/2020 02:36 pm ----------------------------------------------------------------------   Assessment & Plan:  S/p 2nd trimester SAB Large fibroid uterus   Discussed with pt role of fibroids vs possible cervical insufficiency. Would recommend a McDonald cerclage at 12 weeks after genetic testing.   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 Did not smoke during pregnancy. rec complete tob cessation    Patient desires surgical management with abdominal myomectomy.  The risks of surgery were discussed in detail with the patient including but not limited to: bleeding which may require transfusion or reoperation; infection which may require prolonged hospitalization or re-hospitalization and antibiotic therapy; injury to bowel, bladder, ureters and major vessels or other surrounding organs; need for additional procedures including laparotomy; thromboembolic phenomenon, incisional problems and other postoperative or anesthesia complications.  Patient was told that the likelihood that her condition and symptoms will be treated effectively with this surgical management was very high; the postoperative expectations were also discussed in detail. The patient also understands the alternative treatment options which were discussed in full. All questions were answered.  She was told that she will be contacted by our surgical scheduler regarding the time and date of her surgery; routine preoperative instructions of having nothing to eat or drink after midnight on the day prior to surgery and also coming to the hospital 1 1/2 hours prior to her time of surgery were also emphasized.  She was told she may be called for a preoperative appointment about a week prior to surgery and will be given further preoperative instructions at that visit. Printed patient education handouts about the procedure were given to the patient to review at home.  Total face-to-face time with patient  was 35 min.  Greater than 50% was spent in counseling and coordination of care with the patient.   Sandra Gregory L. Harraway-Smith, M.D., Cherlynn June

## 2020-02-21 NOTE — Patient Instructions (Signed)
Myomectomy    Myomectomy is a surgery in which a non-cancerous fibroid (myoma) is removed from the uterus. Myomas are tumors made up of fibrous tissue. They are often called fibroid tumors. Fibroid tumors can range from the size of a pea to the size of a grapefruit. In a myomectomy, the fibroid tumor is removed without removing the uterus. Because these tumors are rarely cancerous, this surgery is usually done only if the tumor is growing or causing symptoms such as pain, pressure, bleeding, or pain with intercourse.  Tell a health care provider about:  · Any allergies you have.  · All medicines you are taking, including vitamins, herbs, eye drops, creams, and over-the-counter medicines.  · Any problems you or family members have had with anesthetic medicines.  · Any blood disorders you have.  · Any surgeries you have had.  · Any medical conditions you have.  What are the risks?  Generally, this is a safe procedure. However, problems may occur, including:  · Bleeding.  · Infection.  · Allergic reactions to medicines.  · Damage to other structures or organs.  · Blood clots in the legs, chest, or brain.  · Scar tissue on other organs and in the pelvis. This may require another surgery to remove the scar tissue.  What happens before the procedure?  Staying hydrated  Follow instructions from your health care provider about hydration, which may include:  · Up to 2 hours before the procedure - you may continue to drink clear liquids, such as water, clear fruit juice, black coffee, and plain tea.  Eating and drinking restrictions  Follow instructions from your health care provider about eating and drinking, which may include:  · 8 hours before the procedure - stop eating heavy meals or foods such as meat, fried foods, or fatty foods.  · 6 hours before the procedure - stop eating light meals or foods, such as toast or cereal.  · 6 hours before the procedure - stop drinking milk or drinks that contain milk.  · 2 hours before  the procedure - stop drinking clear liquids.  General instructions  · Ask your health care provider about:  ? Changing or stopping your regular medicines. This is especially important if you are taking diabetes medicines or blood thinners.  ? Taking medicines such as aspirin and ibuprofen. These medicines can thin your blood. Do not take these medicines before your procedure if your health care provider instructs you not to.  · Do not drink alcohol the day before the surgery.  · Do not use any products that contain nicotine or tobacco, such as cigarettes and e-cigarettes, for 2 weeks before the procedure. If you need help quitting, ask your health care provider.  · Plan to have someone take you home from the hospital or clinic. Also arrange for someone to help you with activities during your recovery.  What happens during the procedure?  · To reduce your risk of infection:  ? Your health care team will wash or sanitize their hands.  ? Your skin will be washed with soap.  ? Hair may be removed from the surgical area.  · An IV tube will be inserted into one of your veins. Medicines will be able to flow directly into your body through this IV tube.  · You will be given one or more of the following:  ? A medicine to help you relax (sedative).  ? A medicine to make you fall asleep (general anesthetic).  ·   into your bladder to collect urine.  Your surgeon will use one of the following methods to perform the procedure. The method used will depend on the size, shape, location, and number of fibroids. Hysteroscopic myomectomy This method may be used when the fibroid tumor is inside the cavity of the uterus. A long, thin tube with a lens (hysteroscope) will be inserted into the  uterus through the vagina. A saline solution will be put into the uterus. This will expand the uterus and allow the surgeon to see the fibroids. Tools will be passed through the hysteroscope to remove the fibroid tumor in pieces. Laparoscopic myomectomy A few small incisions will be made in the lower abdomen. A thin, lighted tube with a camera (laparoscope) will be inserted through one of the incisions. This will give the surgeon a good view of the area. The fibroid tumor will be removed through the other incisions. The incisions will then be closed with stitches (sutures) or staples. Abdominal myomectomy This method is used when the fibroid tumor cannot be removed with a hysteroscope or laparoscope. The surgery will be done through a larger surgical incision in the abdomen. The fibroid tumor will be removed through this incision. The incision will be closed with sutures or staples. Recovery time will be longer if this method is used. The procedures may vary among health care providers and hospitals. What happens after the procedure?  Your blood pressure, heart rate, breathing rate, and blood oxygen level will be monitored until the medicines you were given have worn off.  The IV access tube and catheter will remain on your body for a period of time.  You may be given medicine for pain or to help you sleep.  You may be given an antibiotic medicine if needed.  Do not drive for 24 hours if you were given a sedative. Summary  Myomectomy is surgery to remove a noncancerous fibroid (myoma) from the uterus.  This surgery is usually done only if the tumor is growing or causing symptoms such as pain, pressure, bleeding, or pain during intercourse.  Follow instructions from your health care provider about eating and drinking before the procedure.  Recovery time from this procedure depends on the method. The abdominal method will require a longer recovery time. This information is not intended to  replace advice given to you by your health care provider. Make sure you discuss any questions you have with your health care provider. Document Revised: 12/28/2018 Document Reviewed: 10/06/2016 Elsevier Patient Education  [place] After This sheet gives you information about how to care for yourself after your procedure. Your health care provider may also give you more specific instructions. If you have problems or questions, contact your health care provider. What can I expect after the procedure? After the procedure, it is common to have:  Pain in your abdomen, especially at the incision areas. You will be given pain medicine to control the pain.  Tiredness. This is a normal part of the recovery process. Your energy level will return to normal over the coming weeks.  Vaginal bleeding. This is normal and will stop in the coming weeks.  Constipation. Recovery time from this procedure will depend on the type of procedure you had and your general overall health prior to the procedure. Follow these instructions at home: Medicines  Take over-the-counter and prescription medicines only as told by your health care provider.  Do not take aspirin because it can cause bleeding.  If you  were prescribed an antibiotic medicine, use it as told by your health care provider. Do not stop using the antibiotic even if you start to feel better.  Do not drive or use heavy machinery while taking prescription pain medicine.  Do not drink alcohol while taking prescription pain medicine. Incision care   Follow instructions from your health care provider about how to take care of any incisions. Make sure you: ? Wash your hands with soap and water before you change your bandage (dressing). If soap and water are not available, use hand sanitizer. ? Change your dressing as told by your health care provider. ? Leave stitches (sutures), skin glue, or adhesive strips in place. These  skin closures may need to stay in place for 2 weeks or longer. If adhesive strip edges start to loosen and curl up, you may trim the loose edges. Do not remove adhesive strips completely unless your health care provider tells you to do that.  Check your incision areas every day for signs of infection. Check for: ? Redness, swelling, or pain. ? Fluid or blood. ? Warmth. ? Pus or a bad smell.  Do not take baths, swim, or use a hot tub until your health care provider approves. Take showers as directed by your health care provider. Activity  Return to your normal activities as told by your health care provider. Ask your health care provider what activities are safe for you.  Do not do activities that require a lot of effort until your health care provider says it is okay.  Do not lift anything that is heavier than 15 lb (6.8 kg) until your health care provider says that it is safe.  Do not douche, use tampons, or have sexual intercourse until your health care provider approves.  Walk daily but take frequent rest breaks if you tire easily.  Continue to practice deep breathing and coughing. If it hurts to cough, try holding a pillow against your belly as you cough.  Do not drive until your health care provider approves. General instructions  To prevent or treat constipation while you are taking prescription pain medicine, your health care provider may recommend that you: ? Drink enough fluid to keep your urine clear or pale yellow. ? Take over-the-counter or prescription medicines. ? Eat foods that are high in fiber, such as fresh fruits and vegetables, whole grains, and beans. ? Limit foods that are high in fat and processed sugars, such as fried and sweet foods.  Take your temperature twice a day and write it down. If you develop a fever, this may be a sign that you have an infection.  Do not drink alcohol.  Have someone help you at home for 1 week or until you can do your own  household activities.  Keep all follow-up visits as told by your health care provider. This is important. Contact a health care provider if:  You have a fever.  You have increasing abdominal pain that is not relieved with medicine.  You have nausea, vomiting, or diarrhea.  You have pain when you urinate or you have blood in your urine.  You have a rash on your body.  You have pain or redness where your IV access tube was inserted.  You have redness, swelling, or pain around an incision.  You have fluid or blood coming from an incision.  An incision feels warm to the touch.  You have pus or a bad smell coming from an incision. Get help right  away if:  You have weakness or light-headedness.  You have pain, swelling, or redness in your legs.  You have chest pain.  You faint.  You have shortness of breath.  You have heavy vaginal bleeding.  You have an incision that is opening up. Summary  Recovery time from this procedure will depend on the type of procedure you had and your general overall health prior to the procedure.  If you were prescribed an antibiotic medicine, use it as told by your health care provider. Do not stop using the antibiotic even if you start to feel better.  Do not douche, use tampons, or have sexual intercourse until your health care provider approves.  Return to your normal activities as told by your health care provider. Ask your health care provider what activities are safe for you. This information is not intended to replace advice given to you by your health care provider. Make sure you discuss any questions you have with your health care provider. Document Revised: 08/18/2017 Document Reviewed: 10/06/2016 Elsevier Patient Education  2020 [employer].

## 2020-03-06 ENCOUNTER — Ambulatory Visit: Payer: 59

## 2020-03-18 ENCOUNTER — Ambulatory Visit: Payer: 59

## 2020-03-18 ENCOUNTER — Other Ambulatory Visit: Payer: 59

## 2020-04-13 DIAGNOSIS — D219 Benign neoplasm of connective and other soft tissue, unspecified: Secondary | ICD-10-CM

## 2020-04-15 ENCOUNTER — Encounter (HOSPITAL_COMMUNITY): Admission: RE | Admit: 2020-04-15 | Payer: 59 | Source: Ambulatory Visit

## 2020-04-17 ENCOUNTER — Emergency Department (HOSPITAL_COMMUNITY): Payer: 59

## 2020-04-17 ENCOUNTER — Other Ambulatory Visit (HOSPITAL_COMMUNITY): Payer: 59

## 2020-04-17 ENCOUNTER — Encounter (HOSPITAL_COMMUNITY): Payer: Self-pay | Admitting: Emergency Medicine

## 2020-04-17 ENCOUNTER — Encounter (HOSPITAL_COMMUNITY)
Admission: RE | Admit: 2020-04-17 | Discharge: 2020-04-17 | Disposition: A | Discharge status: Home / Self Care | Payer: 59 | Source: Ambulatory Visit | Attending: Obstetrics & Gynecology | Admitting: Obstetrics & Gynecology

## 2020-04-17 ENCOUNTER — Encounter (HOSPITAL_COMMUNITY): Payer: Self-pay

## 2020-04-17 ENCOUNTER — Inpatient Hospital Stay (HOSPITAL_COMMUNITY)
Admission: EM | Admit: 2020-04-17 | Discharge: 2020-04-23 | DRG: 855 | Disposition: A | Discharge status: Home / Self Care | Payer: 59 | Attending: Obstetrics & Gynecology | Admitting: Obstetrics & Gynecology

## 2020-04-17 ENCOUNTER — Encounter (HOSPITAL_COMMUNITY)
Admission: RE | Admit: 2020-04-17 | Discharge: 2020-04-17 | Disposition: A | Discharge status: Home / Self Care | Payer: 59 | Source: Ambulatory Visit | Attending: Anesthesiology | Admitting: Anesthesiology

## 2020-04-17 ENCOUNTER — Other Ambulatory Visit: Payer: Self-pay

## 2020-04-17 DIAGNOSIS — R197 Diarrhea, unspecified: Secondary | ICD-10-CM | POA: Diagnosis present

## 2020-04-17 DIAGNOSIS — A419 Sepsis, unspecified organism: Secondary | ICD-10-CM | POA: Diagnosis not present

## 2020-04-17 DIAGNOSIS — D573 Sickle-cell trait: Secondary | ICD-10-CM | POA: Diagnosis present

## 2020-04-17 DIAGNOSIS — N920 Excessive and frequent menstruation with regular cycle: Secondary | ICD-10-CM | POA: Diagnosis present

## 2020-04-17 DIAGNOSIS — R0602 Shortness of breath: Secondary | ICD-10-CM | POA: Insufficient documentation

## 2020-04-17 DIAGNOSIS — Z79899 Other long term (current) drug therapy: Secondary | ICD-10-CM

## 2020-04-17 DIAGNOSIS — E876 Hypokalemia: Secondary | ICD-10-CM | POA: Diagnosis present

## 2020-04-17 DIAGNOSIS — Z87891 Personal history of nicotine dependence: Secondary | ICD-10-CM

## 2020-04-17 DIAGNOSIS — Z20822 Contact with and (suspected) exposure to covid-19: Secondary | ICD-10-CM | POA: Diagnosis present

## 2020-04-17 DIAGNOSIS — R509 Fever, unspecified: Secondary | ICD-10-CM | POA: Diagnosis not present

## 2020-04-17 DIAGNOSIS — D219 Benign neoplasm of connective and other soft tissue, unspecified: Secondary | ICD-10-CM | POA: Diagnosis present

## 2020-04-17 DIAGNOSIS — D259 Leiomyoma of uterus, unspecified: Secondary | ICD-10-CM | POA: Diagnosis present

## 2020-04-17 DIAGNOSIS — D500 Iron deficiency anemia secondary to blood loss (chronic): Secondary | ICD-10-CM | POA: Diagnosis present

## 2020-04-17 DIAGNOSIS — Z01818 Encounter for other preprocedural examination: Secondary | ICD-10-CM | POA: Insufficient documentation

## 2020-04-17 DIAGNOSIS — N719 Inflammatory disease of uterus, unspecified: Secondary | ICD-10-CM | POA: Diagnosis present

## 2020-04-17 DIAGNOSIS — D649 Anemia, unspecified: Secondary | ICD-10-CM | POA: Diagnosis not present

## 2020-04-17 DIAGNOSIS — E860 Dehydration: Secondary | ICD-10-CM | POA: Diagnosis present

## 2020-04-17 DIAGNOSIS — R000 Tachycardia, unspecified: Secondary | ICD-10-CM | POA: Diagnosis not present

## 2020-04-17 DIAGNOSIS — R42 Dizziness and giddiness: Secondary | ICD-10-CM | POA: Insufficient documentation

## 2020-04-17 DIAGNOSIS — G8929 Other chronic pain: Secondary | ICD-10-CM | POA: Diagnosis present

## 2020-04-17 DIAGNOSIS — N739 Female pelvic inflammatory disease, unspecified: Secondary | ICD-10-CM | POA: Diagnosis present

## 2020-04-17 LAB — URINALYSIS, ROUTINE W REFLEX MICROSCOPIC
Bilirubin Urine: NEGATIVE
Glucose, UA: NEGATIVE mg/dL
Ketones, ur: NEGATIVE mg/dL
Nitrite: NEGATIVE
Protein, ur: NEGATIVE mg/dL
Specific Gravity, Urine: 1.009 (ref 1.005–1.030)
pH: 6 (ref 5.0–8.0)

## 2020-04-17 LAB — RETICULOCYTES
Immature Retic Fract: 34 % — ABNORMAL HIGH (ref 2.3–15.9)
RBC.: 2.9 MIL/uL — ABNORMAL LOW (ref 3.87–5.11)
Retic Count, Absolute: 37.1 10*3/uL (ref 19.0–186.0)
Retic Ct Pct: 1.3 % (ref 0.4–3.1)

## 2020-04-17 LAB — CBC WITH DIFFERENTIAL/PLATELET
Abs Immature Granulocytes: 0.2 10*3/uL — ABNORMAL HIGH (ref 0.00–0.07)
Basophils Absolute: 0.1 10*3/uL (ref 0.0–0.1)
Basophils Relative: 0 %
Eosinophils Absolute: 0 10*3/uL (ref 0.0–0.5)
Eosinophils Relative: 0 %
HCT: 22 % — ABNORMAL LOW (ref 36.0–46.0)
Hemoglobin: 6.7 g/dL — CL (ref 12.0–15.0)
Immature Granulocytes: 1 %
Lymphocytes Relative: 8 %
Lymphs Abs: 1.7 10*3/uL (ref 0.7–4.0)
MCH: 22.9 pg — ABNORMAL LOW (ref 26.0–34.0)
MCHC: 30.5 g/dL (ref 30.0–36.0)
MCV: 75.1 fL — ABNORMAL LOW (ref 80.0–100.0)
Monocytes Absolute: 0.9 10*3/uL (ref 0.1–1.0)
Monocytes Relative: 4 %
Neutro Abs: 18.9 10*3/uL — ABNORMAL HIGH (ref 1.7–7.7)
Neutrophils Relative %: 87 %
Platelets: 499 10*3/uL — ABNORMAL HIGH (ref 150–400)
RBC: 2.93 MIL/uL — ABNORMAL LOW (ref 3.87–5.11)
RDW: 20.8 % — ABNORMAL HIGH (ref 11.5–15.5)
WBC: 21.8 10*3/uL — ABNORMAL HIGH (ref 4.0–10.5)
nRBC: 0 % (ref 0.0–0.2)

## 2020-04-17 LAB — CBC
HCT: 22.5 % — ABNORMAL LOW (ref 36.0–46.0)
Hemoglobin: 7 g/dL — ABNORMAL LOW (ref 12.0–15.0)
MCH: 23 pg — ABNORMAL LOW (ref 26.0–34.0)
MCHC: 31.1 g/dL (ref 30.0–36.0)
MCV: 73.8 fL — ABNORMAL LOW (ref 80.0–100.0)
Platelets: 565 10*3/uL — ABNORMAL HIGH (ref 150–400)
RBC: 3.05 MIL/uL — ABNORMAL LOW (ref 3.87–5.11)
RDW: 20.7 % — ABNORMAL HIGH (ref 11.5–15.5)
WBC: 23.4 10*3/uL — ABNORMAL HIGH (ref 4.0–10.5)
nRBC: 0 % (ref 0.0–0.2)

## 2020-04-17 LAB — BASIC METABOLIC PANEL
Anion gap: 13 (ref 5–15)
BUN: 5 mg/dL — ABNORMAL LOW (ref 6–20)
CO2: 23 mmol/L (ref 22–32)
Calcium: 9 mg/dL (ref 8.9–10.3)
Chloride: 97 mmol/L — ABNORMAL LOW (ref 98–111)
Creatinine, Ser: 0.95 mg/dL (ref 0.44–1.00)
GFR calc Af Amer: >60 mL/min (ref >60)
GFR calc non Af Amer: >60 mL/min (ref >60)
Glucose, Bld: 115 mg/dL — ABNORMAL HIGH (ref 70–99)
Potassium: 3 mmol/L — ABNORMAL LOW (ref 3.5–5.1)
Sodium: 133 mmol/L — ABNORMAL LOW (ref 135–145)

## 2020-04-17 LAB — LACTIC ACID, PLASMA
Lactic Acid, Venous: 1.4 mmol/L (ref 0.5–1.9)
Lactic Acid, Venous: 1.1 mmol/L (ref 0.5–1.9)

## 2020-04-17 LAB — COMPREHENSIVE METABOLIC PANEL
ALT: 15 U/L (ref 0–44)
AST: 20 U/L (ref 15–41)
Albumin: 2.7 g/dL — ABNORMAL LOW (ref 3.5–5.0)
Alkaline Phosphatase: 77 U/L (ref 38–126)
Anion gap: 14 (ref 5–15)
BUN: 5 mg/dL — ABNORMAL LOW (ref 6–20)
CO2: 21 mmol/L — ABNORMAL LOW (ref 22–32)
Calcium: 8.9 mg/dL (ref 8.9–10.3)
Chloride: 95 mmol/L — ABNORMAL LOW (ref 98–111)
Creatinine, Ser: 0.94 mg/dL (ref 0.44–1.00)
GFR calc Af Amer: >60 mL/min (ref >60)
GFR calc non Af Amer: >60 mL/min (ref >60)
Glucose, Bld: 97 mg/dL (ref 70–99)
Potassium: 3.1 mmol/L — ABNORMAL LOW (ref 3.5–5.1)
Sodium: 130 mmol/L — ABNORMAL LOW (ref 135–145)
Total Bilirubin: 0.8 mg/dL (ref 0.3–1.2)
Total Protein: 8 g/dL (ref 6.5–8.1)

## 2020-04-17 LAB — WET PREP, GENITAL
Clue Cells Wet Prep HPF POC: NONE SEEN
Sperm: NONE SEEN
Trich, Wet Prep: NONE SEEN
Yeast Wet Prep HPF POC: NONE SEEN

## 2020-04-17 LAB — ABO/RH: ABO/RH(D): A POS

## 2020-04-17 LAB — APTT: aPTT: 32 seconds (ref 24–36)

## 2020-04-17 LAB — IRON AND TIBC
Iron: 26 ug/dL — ABNORMAL LOW (ref 28–170)
Saturation Ratios: 13 % (ref 10.4–31.8)
TIBC: 195 ug/dL — ABNORMAL LOW (ref 250–450)
UIBC: 169 ug/dL

## 2020-04-17 LAB — VITAMIN B12: Vitamin B-12: 618 pg/mL (ref 180–914)

## 2020-04-17 LAB — I-STAT BETA HCG BLOOD, ED (MC, WL, AP ONLY): I-stat hCG, quantitative: <5 m[IU]/mL (ref <5)

## 2020-04-17 LAB — PROTIME-INR
INR: 1.2 (ref 0.8–1.2)
Prothrombin Time: 15 seconds (ref 11.4–15.2)

## 2020-04-17 LAB — FERRITIN: Ferritin: 170 ng/mL (ref 11–307)

## 2020-04-17 LAB — SARS CORONAVIRUS 2 BY RT PCR (HOSPITAL ORDER, PERFORMED IN ~~LOC~~ HOSPITAL LAB): SARS Coronavirus 2: NEGATIVE

## 2020-04-17 LAB — FOLATE: Folate: 10.5 ng/mL (ref >5.9)

## 2020-04-17 MED ORDER — ACETAMINOPHEN 325 MG PO TABS
650.0000 mg | ORAL_TABLET | Freq: Once | ORAL | Status: AC | PRN
  Administered 2020-04-17: 650 mg via ORAL
  Filled 2020-04-17: qty 2

## 2020-04-17 MED ORDER — POTASSIUM CHLORIDE CRYS ER 20 MEQ PO TBCR
40.0000 meq | EXTENDED_RELEASE_TABLET | Freq: Once | ORAL | Status: AC
  Administered 2020-04-18: 40 meq via ORAL
  Filled 2020-04-17: qty 2

## 2020-04-17 MED ORDER — SODIUM CHLORIDE 0.9% IV SOLUTION: Freq: Once | INTRAVENOUS | Status: DC

## 2020-04-17 MED ORDER — SODIUM CHLORIDE 0.9 % IV SOLN: 2.0000 g | INTRAVENOUS | Status: DC

## 2020-04-17 MED ORDER — LACTATED RINGERS IV BOLUS (SEPSIS)
1000.0000 mL | Freq: Once | INTRAVENOUS | Status: AC
  Administered 2020-04-17: 1000 mL via INTRAVENOUS

## 2020-04-17 MED ORDER — SODIUM CHLORIDE 0.9 % IV SOLN
2.0000 g | Freq: Once | INTRAVENOUS | Status: AC
  Administered 2020-04-17: 2 g via INTRAVENOUS
  Filled 2020-04-17: qty 20

## 2020-04-17 MED ORDER — LACTATED RINGERS IV BOLUS (SEPSIS)
500.0000 mL | Freq: Once | INTRAVENOUS | Status: AC
  Administered 2020-04-17: 500 mL via INTRAVENOUS

## 2020-04-17 MED ORDER — LACTATED RINGERS IV SOLN: INTRAVENOUS | Status: DC

## 2020-04-17 MED ORDER — SODIUM CHLORIDE 0.9 % IV BOLUS
1000.0000 mL | Freq: Once | INTRAVENOUS | Status: AC
  Administered 2020-04-17: 1000 mL via INTRAVENOUS

## 2020-04-17 NOTE — ED Notes (Signed)
Patient transported to Ultrasound 

## 2020-04-17 NOTE — Pre-Procedure Instructions (Signed)
K[facility]en[facility] at [place], [place] [address] Phone: [phone] Fax: [phone]      Your procedure is scheduled on Tuesday August 3rd.  Report to [facility] Main Entrance "A" at 1000 A.M., and check in at the Admitting office.  Call this number if you have problems the morning of surgery:  [phone]  Call [phone] if you have any questions prior to your surgery date Monday-Friday 8am-4pm    Remember:  Do not eat after midnight the night before your surgery  You may drink clear liquids until 9am the morning of your surgery.   Clear liquids allowed are: Water, Non-Citrus Juices (without pulp), Carbonated Beverages, Clear Tea, Black Coffee Only, and Gatorade    Take these medicines the morning of surgery with A SIP OF WATER   acetaminophen (TYLENOL) 325 MG tablet as needed for pain  As of today, STOP taking any Aspirin (unless otherwise instructed by your surgeon) Aleve, Naproxen, Ibuprofen, Motrin, Advil, Goody's, BC's, all herbal medications, fish oil, and all vitamins.                      Do not wear jewelry, make up, or nail polish            Do not wear lotions, powders, perfumes, or deodorant.            Do not shave 48 hours prior to surgery.              Do not bring valuables to the hospital.            [facility] is not responsible for any belongings or valuables.  Do NOT Smoke (Tobacco/Vaping) or drink Alcohol 24 hours prior to your procedure If you use a CPAP at night, you may bring all equipment for your overnight stay.   Contacts, glasses, dentures or bridgework may not be worn into surgery.      For patients admitted to the hospital, discharge time will be determined by your treatment team.   Patients discharged the day of surgery will not be allowed to drive home, and someone needs to stay with them for 24 hours.    Special instructions:   [facility]- Preparing  For Surgery  Before surgery, you can play an important role. Because skin is not sterile, your skin needs to be as free of germs as possible. You can reduce the number of germs on your skin by washing with CHG (chlorahexidine gluconate) Soap before surgery.  CHG is an antiseptic cleaner which kills germs and bonds with the skin to continue killing germs even after washing.    Oral Hygiene is also important to reduce your risk of infection.  Remember - BRUSH YOUR TEETH THE MORNING OF SURGERY WITH YOUR REGULAR TOOTHPASTE  Please do not use if you have an allergy to CHG or antibacterial soaps. If your skin becomes reddened/irritated stop using the CHG.  Do not shave (including legs and underarms) for at least 48 hours prior to first CHG shower. It is OK to shave your face.  Please follow these instructions carefully.   1. Shower the NIGHT BEFORE SURGERY and the MORNING OF SURGERY with CHG Soap.   2. If you chose to wash your hair, wash your hair first as usual with your normal shampoo.  3. After you shampoo, rinse your hair and body thoroughly to remove the shampoo.  4. Use CHG as you would any other liquid soap. You can apply CHG directly to the skin and wash gently with a scrungie or a clean washcloth.   5. Apply the CHG Soap to your body ONLY FROM THE NECK DOWN.  Do not use on open wounds or open sores. Avoid contact with your eyes, ears, mouth and genitals (private parts). Wash Face and genitals (private parts)  with your normal soap.   6. Wash thoroughly, paying special attention to the area where your surgery will be performed.  7. Thoroughly rinse your body with warm water from the neck down.  8. DO NOT shower/wash with your normal soap after using and rinsing off the CHG Soap.  9. Pat yourself dry with a CLEAN TOWEL.  10. Wear CLEAN PAJAMAS to bed the night before surgery  11. Place CLEAN SHEETS on your bed the night of your first shower and DO NOT SLEEP WITH PETS.   Day of  Surgery: Wear Clean/Comfortable clothing the morning of surgery Do not apply any deodorants/lotions.   Remember to brush your teeth WITH YOUR REGULAR TOOTHPASTE.   Please read over the following fact sheets that you were given.

## 2020-04-17 NOTE — ED Provider Notes (Signed)
[facility] EMERGENCY DEPARTMENT Provider Note   CSN: [id number] Arrival date & time: 04/17/20  1501     History Chief Complaint  Patient presents with  . Fever    Sandra Gregory is a 34 y.o. female.  HPI 34 year old female with a history of anemia, depression, hemoglobin A-S genotype, fibroids, subchorionic hemorrhage with recent miscarriage in May presents to the ER from preop with fever, white count of 23 and tachycardia with her heart rate in the 140s.  Patient had presented to [facility] preop for lab work and screening for a myomectomy which was to be performed on Tuesday 8/3.  She was noted with the above symptoms, and sent here to the ER for further work-up.  Patient states that she has had no noticeable chills, no measurable fevers, cough, headache, neck stiffness, abdominal pain, nausea, vomiting, diarrhea dysuria.  She states that she has been having some intermittent dizziness, however this has been consistent since her miscarriage back in May.  She states that she has some low back pain, which she has been attributing to her large fibroids for which she was supposed to be having surgery on Tuesday.  She states that she feels like her legs are very heavy when she walks, but this is all been unchanged since May.  She states that she has been anemic since the miscarriage, and has been having significant amount of vaginal discharge.  She has not been vaccinated for Covid.  She has not been sexually active since the miscarriage.  Denies any alcohol or IV drug use.     Past Medical History:  Diagnosis Date  . Anemia   . Anxiety   . Depression   . Hemoglobin A-S genotype ([place]) 01/24/2020    Patient Active Problem List   Diagnosis Date Noted  . Fever 04/17/2020  . Tachycardia 04/17/2020  . Iron deficiency anemia due to chronic blood loss 04/17/2020  . Fibroids 04/13/2020  . Sickle cell trait in mother affecting childbirth ([place]) 01/24/2020  . Subchorionic  hemorrhage 01/09/2020  . Uterine fibroids affecting pregnancy, antepartum 12/26/2019    Past Surgical History:  Procedure Laterality Date  . NO PAST SURGERIES       OB History    Gravida  2   Para  1   Term  1   Preterm  0   AB  0   Living  1     SAB  0   TAB  0   Ectopic  0   Multiple  0   Live Births  1           Family History  Problem Relation Age of Onset  . Cancer Maternal Grandmother        breast  . Diabetes Maternal Grandmother   . Hypertension Maternal Grandmother   . Diabetes Maternal Grandfather   . Hypertension Maternal Grandfather     Social History   Tobacco Use  . Smoking status: Former Smoker    Types: Cigarettes    Quit date: 11/22/2019    Years since quitting: 0.4  . Smokeless tobacco: Never Used  Vaping Use  . Vaping Use: Never used  Substance Use Topics  . Alcohol use: Yes    Alcohol/week: 1.0 standard drink    Types: 1 Glasses of wine per week    Comment: social  . Drug use: Not Currently    Types: Marijuana    Comment: not since pregnancy    Home Medications Prior to  Admission medications   Medication Sig Start Date End Date Taking? Authorizing Provider  acetaminophen (TYLENOL) 500 MG tablet Take 500 mg by mouth every 6 (six) hours as needed for mild pain.   Yes [provider]  iron polysaccharides (NIFEREX) 150 MG capsule Take 1 capsule (150 mg total) by mouth daily. 01/24/20  Yes Anyanwu, Sallyanne Havers, MD  traMADol (ULTRAM) 50 MG tablet Take 1 tablet (50 mg total) by mouth every 6 (six) hours as needed for severe pain. 12/26/19  Yes Lavonia Drafts, MD    Allergies    Patient has no known allergies.  Review of Systems   Review of Systems  Constitutional: Negative for chills and fever.  HENT: Negative for ear pain and sore throat.   Eyes: Negative for pain and visual disturbance.  Respiratory: Negative for cough and shortness of breath.   Cardiovascular: Negative for chest pain and palpitations.    Gastrointestinal: Negative for abdominal pain and vomiting.  Genitourinary: Positive for vaginal discharge. Negative for dysuria and hematuria.  Musculoskeletal: Positive for back pain. Negative for arthralgias.  Skin: Negative for color change and rash.  Neurological: Positive for dizziness. Negative for seizures and syncope.  All other systems reviewed and are negative.   Physical Exam Updated Vital Signs BP 120/82   Pulse 96   Temp 99.4 F (37.4 C) (Oral)   Resp 14   Ht 5\' 7"  (1.702 m)   Wt 76.7 kg   SpO2 100%   BMI 26.47 kg/m   Physical Exam Vitals and nursing note reviewed. Exam conducted with a chaperone present Freida Busman, NT).  Constitutional:      General: She is not in acute distress.    Appearance: Normal appearance. She is well-developed. She is diaphoretic. She is not ill-appearing or toxic-appearing.     Comments: Slightly diaphoretic, nontoxic-appearing, in no acute distress, resting comfortably in the ER bed  HENT:     Head: Normocephalic and atraumatic.     Mouth/Throat:     Mouth: Mucous membranes are moist.  Eyes:     Conjunctiva/sclera: Conjunctivae normal.     Pupils: Pupils are equal, round, and reactive to light.  Cardiovascular:     Rate and Rhythm: Normal rate and regular rhythm.     Pulses: Normal pulses.     Heart sounds: Normal heart sounds. No murmur heard.   Pulmonary:     Effort: Pulmonary effort is normal. No respiratory distress.     Breath sounds: Normal breath sounds.  Abdominal:     General: Abdomen is flat.     Palpations: Abdomen is soft.     Tenderness: There is no abdominal tenderness. There is no right CVA tenderness, left CVA tenderness or guarding.  Genitourinary:    Exam position: Knee-chest position.     Vagina: Bleeding present.     Cervix: Discharge (Pus appearing bloody discharge) present. No cervical motion tenderness, erythema or cervical bleeding.     Adnexa:        Right: No mass or tenderness.         Left:  Tenderness present. No mass.    Musculoskeletal:        General: No tenderness or deformity. Normal range of motion.     Cervical back: Normal range of motion and neck supple.  Skin:    General: Skin is warm.     Capillary Refill: Capillary refill takes less than 2 seconds.     Findings: No erythema or rash.  Neurological:  General: No focal deficit present.     Mental Status: She is alert and oriented to person, place, and time.  Psychiatric:        Mood and Affect: Mood normal.        Behavior: Behavior normal.     ED Results / Procedures / Treatments   Labs (all labs ordered are listed, but only abnormal results are displayed) Labs Reviewed  WET PREP, GENITAL - Abnormal; Notable for the following components:      Result Value   WBC, Wet Prep HPF POC FEW (*)    All other components within normal limits  COMPREHENSIVE METABOLIC PANEL - Abnormal; Notable for the following components:   Sodium 130 (*)    Potassium 3.1 (*)    Chloride 95 (*)    CO2 21 (*)    BUN 5 (*)    Albumin 2.7 (*)    All other components within normal limits  CBC WITH DIFFERENTIAL/PLATELET - Abnormal; Notable for the following components:   WBC 21.8 (*)    RBC 2.93 (*)    Hemoglobin 6.7 (*)    HCT 22.0 (*)    MCV 75.1 (*)    MCH 22.9 (*)    RDW 20.8 (*)    Platelets 499 (*)    Neutro Abs 18.9 (*)    Abs Immature Granulocytes 0.20 (*)    All other components within normal limits  URINALYSIS, ROUTINE W REFLEX MICROSCOPIC - Abnormal; Notable for the following components:   Hgb urine dipstick MODERATE (*)    Leukocytes,Ua MODERATE (*)    Bacteria, UA RARE (*)    All other components within normal limits  IRON AND TIBC - Abnormal; Notable for the following components:   Iron 26 (*)    TIBC 195 (*)    All other components within normal limits  RETICULOCYTES - Abnormal; Notable for the following components:   RBC. 2.90 (*)    Immature Retic Fract 34.0 (*)    All other components within normal  limits  SARS CORONAVIRUS 2 BY RT PCR (HOSPITAL ORDER, [facility])  CULTURE, BLOOD (SINGLE)  URINE CULTURE  LACTIC ACID, PLASMA  LACTIC ACID, PLASMA  PROTIME-INR  APTT  VITAMIN B12  FERRITIN  FOLATE  I-STAT BETA HCG BLOOD, ED ([facility], [facility], [facility] ONLY)  PREPARE RBC (CROSSMATCH)  TYPE AND SCREEN  GC/CHLAMYDIA PROBE AMP ([facility]) NOT AT [facility]    EKG EKG Interpretation  Date/Time:  Friday April 17 2020 16:42:35 EDT Ventricular Rate:  108 PR Interval:    QRS Duration: 93 QT Interval:  335 QTC Calculation: 449 R Axis:   24 Text Interpretation: Sinus tachycardia No acute changes No significant change since last tracing Confirmed by Varney Biles [phone]) on 04/17/2020 5:30:03 PM   Radiology DG Chest 2 View  Result Date: 04/17/2020 CLINICAL DATA:  Shortness of breath.  Dizziness. EXAM: CHEST - 2 VIEW COMPARISON:  None. FINDINGS: The cardiomediastinal contours are normal. The lungs are clear. Pulmonary vasculature is normal. No consolidation, pleural effusion, or pneumothorax. No acute osseous abnormalities are seen. IMPRESSION: Negative radiographs of the chest. Electronically Signed   By: Keith Rake M.D.   On: 04/17/2020 14:52   US Pelvis Complete  Result Date: 04/17/2020 CLINICAL DATA:  34 year old female with vaginal discharge. EXAM: TRANSABDOMINAL ULTRASOUND OF PELVIS TECHNIQUE: Transabdominal ultrasound examination of the pelvis was performed including evaluation of the uterus, ovaries, adnexal regions, and pelvic cul-de-sac. COMPARISON:  Pelvic ultrasound dated 02/11/2020. FINDINGS:  Uterus Measurements: 15.7 x 10.8 x 14.5 cm = volume: 1273 mL. The uterus is enlarged, heterogeneous, and myomatous. The uterus is poorly visualized due to shadowing called by calcified fibroid. There is a 12.2 x 9.4 x 12.4 cm upper body or fundal intramural fibroid with probable submucosal component. A 7.9 x 6.3 x 8.6 cm partially exophytic posterior body fibroid is also noted.  Several additional fibroids are likely present but poorly visualized. Endometrium The endometrium is not visualized. Right ovary Measurements: 3.0 x 1.7 x 2.6 cm = volume: 7.1 mL. Normal appearance/no adnexal mass. Left ovary The left ovary is not visualized. Other findings:  No abnormal free fluid. IMPRESSION: 1. Enlarged myomatous uterus. 2. Unremarkable right ovary. 3. Nonvisualization of the endometrium and the left ovary. Electronically Signed   By: Anner Crete M.D.   On: 04/17/2020 20:36    Procedures Procedures (including critical care time) CRITICAL CARE Performed by: Polo Riley   Total critical care time: 50  minutes  Critical care time was exclusive of separately billable procedures and treating other patients.  Critical care was necessary to treat or prevent imminent or life-threatening deterioration.  Critical care was time spent personally by me on the following activities: development of treatment plan with patient and/or surrogate as well as nursing, discussions with consultants, evaluation of patient's response to treatment, examination of patient, obtaining history from patient or surrogate, ordering and performing treatments and interventions, ordering and review of laboratory studies, ordering and review of radiographic studies, pulse oximetry and re-evaluation of patient's condition.  Medications Ordered in ED Medications  0.9 %  sodium chloride infusion (Manually program via Guardrails IV Fluids) (has no administration in time range)  lactated ringers infusion (has no administration in time range)  acetaminophen (TYLENOL) tablet 650 mg (650 mg Oral Given 04/17/20 1528)  sodium chloride 0.9 % bolus 1,000 mL (0 mLs Intravenous Stopped 04/17/20 2153)  cefTRIAXone (ROCEPHIN) 2 g in sodium chloride 0.9 % 100 mL IVPB (0 g Intravenous Stopped 04/17/20 2044)  lactated ringers bolus 1,000 mL (1,000 mLs Intravenous New Bag/Given 04/17/20 2203)    And  lactated ringers bolus  1,000 mL (1,000 mLs Intravenous New Bag/Given 04/17/20 2129)    And  lactated ringers bolus 500 mL (500 mLs Intravenous New Bag/Given 04/17/20 2204)    ED Course  I have reviewed the triage vital signs and the nursing notes.  Pertinent labs & imaging results that were available during my care of the patient were reviewed by me and considered in my medical decision making (see chart for details).    MDM Rules/Calculators/A&P                          34 year old female presents with fever, tachycardia, white count of 23, and hemoglobin of 7 noted in preop earlier today On presentation, the patient is slightly diaphoretic, however nontoxic-appearing, no acute respiratory distress, sitting comfortably in the ER bed, smiling.   VITALS: Originally on presentation, the patient presented with soft blood pressures of 106/80, and a fever of 101.1 and tachycardic with a heart of 134.  Respiratory rate of 19 and sats are 99%.    PE: soft nontender abdomen, clear lung sounds.  Pelvic exam with no CMT, but some left-sided adnexal tenderness was noted.  Pelvic exam also had purulent pus appearing discharge.  Cervix is difficult to locate, patient states that the OB stated that her fibroids were so large that they were obscuring the  cervix.  Code sepsis was initiated with order set; patient received 1 L of fluids  DDX includes: sepsis, PID, tubo-ovarian abscess, UTI, appendicitis, pneumonia, COVID, paraspinal abscess  I personally reviewed and interpreted her lab work:  CBC with a leukocytosis of 21.8.  Most notably, hemoglobin of 6.7, platelets of 499.   CMP with mild hyponatremia of 130, potassium 3.1.  Normal renal function and liver function tests.  No anion gap. Normal lactate, PT/INR, APTT Blood cultures pending  Pregnancy negative.  EKG with sinus tach.  Patient typed and screened, will order 1 unit of blood here in the ED given symptomatic anemia and hemoglobin of 6.7.  Patient was given 1 L of  fluids, and prophylactic 2g of Rocephin given as my suspicion is that her source of infection is of GYN origin.  Given pelvic exam findings, will order transvaginal ultrasound to rule out tubo-ovarian abscess, torsion   IMAGING: Transvaginal ultrasound without evidence of torsion, tubo-ovarian abscess.  Patient maintains that she has no abdominal pain and her abdomen continues to be soft and nontender.  I do not think she needs any imaging at this time.  Chest x-ray done in preop without evidence of pneumonia or infection.  Consulted hospitalist team for admission for further management given patient did fall under code sepsis criteria and is receiving a blood transfusion.  Patient has remained hemodynamically stable here in the ED and is stable for admission.  Discussed the case with Dr. Kathrynn Humble who is agreeable to the above plan and disposition.   Final Clinical Impression(s) / ED Diagnoses Final diagnoses:  Anemia, unspecified type  Fever, unspecified fever cause    Rx / DC Orders ED Discharge Orders    None       Garald Balding, PA-C 04/17/20 2302    Varney Biles, MD 04/18/20 0006

## 2020-04-17 NOTE — H&P (Addendum)
PCP:  OB/GYN: Jenean Lindau   Chief Complaint:  Tachycardia, fever  HPI: This is a 34 year old female who states she had a miscarriage May 25. She states since then she cannot stand for long time because of dizziness. She says every time she stands she gets dizzy. She states she always feels presyncopal. At one point she had complete loss of appetite but that has since resolved. As result she had loss of weight loss. She states she has been weak, to the point where after shower she has to lay down. She denies any pain anywhere including abdominal. She does have shortness of breath but this is mainly associated with activity. She has a history of anemia and has been on iron tablets most of her adult life.  Today she came in for preop testing for myomectomy. Vitals show tachycardia up to 140, temperature of 102.4. She was sent to the ER. She met sepsis criteria. With a white blood count of 21 and hemoglobin of 6.7.  Patient has a history of metromenorrhagia. She has several large fibroids with up to12 cm long. She uses 8-hour sanitary napkins and goes through approximately 4 a day. The patient states she has not been ill over the past few days, no fevers or chills, not asymptomatic with illness.  The hospitalist have been asked admit. History provided by the patient who is alert and oriented.  Review of Systems:  The patient denies vision loss, decreased hearing, hoarseness, chest pain, syncope, dyspnea on exertion, peripheral edema, balance deficits, hemoptysis, abdominal pain, melena, hematochezia, severe indigestion/heartburn, hematuria, incontinence, genital sores, muscle weakness, suspicious skin lesions, transient blindness, difficulty walking, depression, unusual weight change, enlarged lymph nodes, angioedema, and breast masses. Positives: Dizziness, lightheadedness, fatigue, shortness of breath, fever, anorexia, weight loss, menorrhagia, presyncope  Past Medical History: Past Medical  History:  Diagnosis Date  . Anemia   . Anxiety   . Depression   . 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 NO PAST SURGERIES      Medications: Prior to Admission medications   Medication Sig Start Date End Date Taking? Authorizing Provider  acetaminophen (TYLENOL) 500 MG tablet Take 500 mg by mouth every 6 (six) hours as needed for mild pain.   Yes [provider]  iron polysaccharides (NIFEREX) 150 MG capsule Take 1 capsule (150 mg total) by mouth daily. 01/24/20  Yes Anyanwu, Sallyanne Havers, MD  traMADol (ULTRAM) 50 MG tablet Take 1 tablet (50 mg total) by mouth every 6 (six) hours as needed for severe pain. 12/26/19  Yes Lavonia Drafts, MD    Allergies:  No Known Allergies  Social History:  reports that she quit smoking about 4 months ago. Her smoking use included cigarettes. She has never used smokeless tobacco. She reports current alcohol use of about 1.0 standard drink of alcohol per week. She reports previous drug use. Drug: Marijuana.  Family History: Family History  Problem Relation Age of Onset  . Cancer Maternal Grandmother        breast  . Diabetes Maternal Grandmother   . Hypertension Maternal Grandmother   . Diabetes Maternal Grandfather   . Hypertension Maternal Grandfather     Physical Exam: Vitals:   04/17/20 1943 04/17/20 2140 04/17/20 2145 04/17/20 2200  BP: (!) 105/63  120/77 120/77  Pulse: (!) 114 98 98 97  Resp: 22 16 20 16   Temp:      TempSrc:      SpO2: 100%  98% 98% 100%  Weight:      Height:        General:  Alert and oriented times three, well developed and nourished, no acute distress Eyes: PERRLA, pale conjunctiva, no scleral icterus ENT: Moist oral mucosa, neck supple, no thyromegaly Lungs: clear to ascultation, no wheeze, no crackles, no use of accessory muscles Cardiovascular: regular rate and rhythm, no regurgitation, no gallops, no murmurs. No carotid bruits, no  JVD Abdomen: soft, positive BS, non-tender, non-distended, no organomegaly, not an acute abdomen. Fullness in LLQ and RUQ GU: not examined Neuro: CN II - XII grossly intact, sensation intact Musculoskeletal: strength 5/5 all extremities, no clubbing, cyanosis or edema Skin: no rash, no subcutaneous crepitation, no decubitus Psych: appropriate patient   Labs on Admission:  Recent Labs    04/17/20 1321 04/17/20 1645  NA 133* 130*  K 3.0* 3.1*  CL 97* 95*  CO2 23 21*  GLUCOSE 115* 97  BUN 5* 5*  CREATININE 0.95 0.94  CALCIUM 9.0 8.9   Recent Labs    04/17/20 1645  AST 20  ALT 15  ALKPHOS 77  BILITOT 0.8  PROT 8.0  ALBUMIN 2.7*   No results for input(s): LIPASE, AMYLASE in the last 72 hours. Recent Labs    04/17/20 1320 04/17/20 1645  WBC 23.4* 21.8*  NEUTROABS  --  18.9*  HGB 7.0* 6.7*  HCT 22.5* 22.0*  MCV 73.8* 75.1*  PLT 565* 499*   No results for input(s): CKTOTAL, CKMB, CKMBINDEX, TROPONINI in the last 72 hours. Invalid input(s): POCBNP No results for input(s): DDIMER in the last 72 hours. No results for input(s): HGBA1C in the last 72 hours. No results for input(s): CHOL, HDL, LDLCALC, TRIG, CHOLHDL, LDLDIRECT in the last 72 hours. No results for input(s): TSH, T4TOTAL, T3FREE, THYROIDAB in the last 72 hours.  Invalid input(s): FREET3 Recent Labs    04/17/20 1955 04/17/20 1958  VITAMINB12 618  --   FOLATE 10.5  --   FERRITIN 170  --   TIBC 195*  --   IRON 26*  --   RETICCTPCT  --  1.3    Micro Results: Recent Results (from the past 240 hour(s))  Wet prep, genital     Status: Abnormal   Collection Time: 04/17/20  7:13 PM  Result Value Ref Range Status   Yeast Wet Prep HPF POC NONE SEEN NONE SEEN Final   Trich, Wet Prep NONE SEEN NONE SEEN Final   Clue Cells Wet Prep HPF POC NONE SEEN NONE SEEN Final   WBC, Wet Prep HPF POC FEW (A) NONE SEEN Final   Sperm NONE SEEN  Final    Comment: Performed at [facility], [place]. [address],  [place], [place] [address]  SARS Coronavirus 2 by RT PCR (hospital order, performed in [facility] hospital lab) Nasopharyngeal Nasopharyngeal Swab     Status: None   Collection Time: 04/17/20  7:55 PM   Specimen: Nasopharyngeal Swab  Result Value Ref Range Status   SARS Coronavirus 2 NEGATIVE NEGATIVE Final    Comment: (NOTE) SARS-CoV-2 target nucleic acids are NOT DETECTED.  The SARS-CoV-2 RNA is generally detectable in upper and lower respiratory specimens during the acute phase of infection. The lowest concentration of SARS-CoV-2 viral copies this assay can detect is 250 copies / mL. A negative result does not preclude SARS-CoV-2 infection and should not be used as the sole basis for treatment or other patient management decisions.  A negative result may occur  with improper specimen collection / handling, submission of specimen other than nasopharyngeal swab, presence of viral mutation(s) within the areas targeted by this assay, and inadequate number of viral copies (<250 copies / mL). A negative result must be combined with clinical observations, patient history, and epidemiological information.  Fact Sheet for Patients:   StrictlyIdeas.no  Fact Sheet for Healthcare Providers: BankingDealers.co.za  This test is not yet approved or  cleared by the [place] FDA and has been authorized for detection and/or diagnosis of SARS-CoV-2 by FDA under an Emergency Use Authorization (EUA).  This EUA will remain in effect (meaning this test can be used) for the duration of the COVID-19 declaration under Section 564(b)(1) of the Act, 21 U.S.C. section 360bbb-3(b)(1), unless the authorization is terminated or revoked sooner.  Performed at [facility], [place] [address] [place], [place] [address]      Radiological Exams on Admission: DG Chest 2 View  Result Date: 04/17/2020 CLINICAL DATA:  Shortness of breath.  Dizziness. EXAM: CHEST -  2 VIEW COMPARISON:  None. FINDINGS: The cardiomediastinal contours are normal. The lungs are clear. Pulmonary vasculature is normal. No consolidation, pleural effusion, or pneumothorax. No acute osseous abnormalities are seen. IMPRESSION: Negative radiographs of the chest. Electronically Signed   By: Keith Rake M.D.   On: 04/17/2020 14:52   US Pelvis Complete  Result Date: 04/17/2020 CLINICAL DATA:  34 year old female with vaginal discharge. EXAM: TRANSABDOMINAL ULTRASOUND OF PELVIS TECHNIQUE: Transabdominal ultrasound examination of the pelvis was performed including evaluation of the uterus, ovaries, adnexal regions, and pelvic cul-de-sac. COMPARISON:  Pelvic ultrasound dated 02/11/2020. FINDINGS: Uterus Measurements: 15.7 x 10.8 x 14.5 cm = volume: 1273 mL. The uterus is enlarged, heterogeneous, and myomatous. The uterus is poorly visualized due to shadowing called by calcified fibroid. There is a 12.2 x 9.4 x 12.4 cm upper body or fundal intramural fibroid with probable submucosal component. A 7.9 x 6.3 x 8.6 cm partially exophytic posterior body fibroid is also noted. Several additional fibroids are likely present but poorly visualized. Endometrium The endometrium is not visualized. Right ovary Measurements: 3.0 x 1.7 x 2.6 cm = volume: 7.1 mL. Normal appearance/no adnexal mass. Left ovary The left ovary is not visualized. Other findings:  No abnormal free fluid. IMPRESSION: 1. Enlarged myomatous uterus. 2. Unremarkable right ovary. 3. Nonvisualization of the endometrium and the left ovary. Electronically Signed   By: Anner Crete M.D.   On: 04/17/2020 20:36    Assessment/Plan Present on Admission: Symptomatic anemia due to iron deficiency anemia likely from menorrhagia -Bring in for overnight observation admit telemetry -Total of 2 units packed red blood cells transfusion ordered -Posttransfusion H&H -Serial H&H every 12 -No IV Protonix have been ordered. Diet has been ordered. Unlikely  GI source of anemia  . Sepsis/fever/Tachycardia -No clear source of infection. May be viral in etiology. UA negative, no pulmonary symptoms. Shortness of breath likely due to his symptomatic anemia. Tachycardia multifactorial fevers and symptomatic anemia. -However, given leukocytosis will continue empiric antibiotics. Will order chest x-ray  Hypokalemia -Replete p.o., BMP in a.m.  Fibroids Menorrhagia -Per OB/GYN  Sandra Gregory 04/17/2020, 10:39 PM

## 2020-04-17 NOTE — ED Triage Notes (Signed)
Pt arrives from pre op where she was presenting to have pre surgical labs for myomectomy scheduled for Tuesday. Pt was found to be tachycardic with HR 140, wbc 23, and hbg 7. Pt had recent miscarriage with heavy bleeding and has been anemic from that. Pt had temp of 102.1 in pre op. BMP, CBC ekg and cxray done already for preop.

## 2020-04-17 NOTE — Progress Notes (Signed)
PCP - Dr. Ihor Dow  Chest x-ray - 04/17/20 Dyspnea on exertion since miscarriage feels it has worsened EKG - 04/17/20 Tachycardic Stress Test - Denies ECHO - Denies Cardiac Cath - Denies  Sleep Study - Denies  DM - Denies  ERAS Protcol -Yes PRE-SURGERY Ensure given   COVID TEST- 04/17/20   Anesthesia review: Yes tachycardic and SOB on PAT visit. Running a fever.  Allyson, PA made aware.  EKG and CXR obtained on PAT visit.  Patient denies cough and chest pain at PAT appointment.    All instructions explained to the patient, with a verbal understanding of the material. Patient agrees to go over the instructions while at home for a better understanding. Patient also instructed to self quarantine after being tested for COVID-19. The opportunity to ask questions was provided.

## 2020-04-17 NOTE — Progress Notes (Signed)
Anesthesia PAT Evaluation:   Case: [id number] Date/Time: 04/21/20 1150   Procedure: ABDOMINAL MYOMECTOMY (N/A )   Anesthesia type: Choice   Pre-op diagnosis: FIBROIDS   Location: [facility] OR ROOM 07 / [place] OR   Surgeons: Lavonia Drafts, MD      DISCUSSION: Patient is a 34 year old female scheduled for the above procedure.   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 2nd trimester ([redacted]w[redacted]d) miscarriage on 02/11/20 and reports a significant amount of bleeding at that time.  At 04/17/20 PAT visit, she arrived with HR in the 140's. BP 119/89. Temp was initially 101.2, but 99.7 on recheck. HR was in the 120's on EKG. She reported intermittent low back pain since the miscarriage with some today, bot not usual for her. She thinks her SOB has been worse (even in the shower, walking up stairs). There has been some dizziness on standing at times. She denied chest pain. Has notified a little pedal edema but not calf swelling. No dysuria, although she feels her uterus drops when she urinates. Diarrhea 2 days ago. She started her menses today. She can't lie on her stomach because of uterine tenderness. No cough or sore throat. She doesn't feel sick, but feels likes she's "dragging" today. Labs came back showing WBC 23.4K, H/H 7.0/22.5 (down from 9.8/28.7 on 02/11/20), PLT 565, Cr 0.95, K 3.0.   I evaluated her during her PAT visit. She did not appear in any acute distress, although understandably a little emotional about everything going on. She was able to ambulate independently. Her lung sounds were clear. Heart regular but tachycardic. No significant ankle edema. Discussed that given her anemia, tachycardia, leukocytosis I feel that she needs further evaluation. I also dicussed this with Dr. Ihor Dow who was in agreement. Tachycardia and SOB may be coming from anemia, but with WBC > 23K I am concerned about infection, potentially GU/GYN  source. Her lungs are clear and CXR was negative. She did not report pleuritic chest pain and sats 100%. She did have a transvaginal US at the time of her miscarriage but due to fibroids the absence or presence of retained products of conception could note be determined on that exam. She is in agreement to go to ED for further evaluation and work-up. Her spouse [place] at her side. Patient was transported by Gleason, Ginger, RN with report to ED RN at 3:22 PM.   COVID-19 test was scheduled for 04/17/20, but she was unable to make that appointment given transfer to ED. Plans to proceed as scheduled will depend on work-up findings during 04/17/20 ED visit.   VS: BP 120/79   Pulse (!) 126   Temp 37.6 C (Oral)   Resp 20   Ht 5\' 7"  (1.702 m)   Wt 76.7 kg   SpO2 100%   BMI 26.50 kg/m    PROVIDERS: No PCP   LABS: See DISCUSSION. (all labs ordered are listed, but only abnormal results are displayed)  Labs Reviewed  CBC - Abnormal; Notable for the following components:      Result Value   WBC 23.4 (*)    RBC 3.05 (*)    Hemoglobin 7.0 (*)    HCT 22.5 (*)    MCV 73.8 (*)    MCH 23.0 (*)    RDW 20.7 (*)    Platelets 565 (*)    All other components within normal limits  BASIC METABOLIC PANEL - Abnormal; Notable for the following components:  Sodium 133 (*)    Potassium 3.0 (*)    Chloride 97 (*)    Glucose, Bld 115 (*)    BUN 5 (*)    All other components within normal limits   CBC Latest Ref Rng & Units 04/17/2020 02/11/2020 01/09/2020  WBC 4.0 - 10.5 K/uL 23.4(H) 9.7 9.3  Hemoglobin 12.0 - 15.0 g/dL 7.0(L) 9.8(L) 10.7(L)  Hematocrit 36 - 46 % 22.5(L) 28.7(L) 30.4(L)  Platelets 150 - 400 K/uL 565(H) 213 201    IMAGES: CXR 04/17/20: FINDINGS: The cardiomediastinal contours are normal. The lungs are clear. Pulmonary vasculature is normal. No consolidation, pleural effusion, or pneumothorax. No acute osseous abnormalities are seen. IMPRESSION: Negative radiographs of the  chest.  Transvaginal US 02/11/20: IMPRESSION: Enlarged fibroid uterus is again noted. Due to the fibroids, the endometrium could not be clearly visualized, and therefore the absence or presence of retained products of conception cannot be determined on this exam.   EKG: 04/17/20: Sinus tachycardia at 122 bpm Possible Left atrial enlargement Borderline ECG   CV: N/A  Past Medical History:  Diagnosis Date  . Anemia   . Anxiety   . Depression   . 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 NO PAST SURGERIES      MEDICATIONS: . acetaminophen (TYLENOL) 325 MG tablet  . bisacodyl (DULCOLAX) 5 MG EC tablet  . diphenhydrAMINE (BENADRYL) 25 MG tablet  . iron polysaccharides (NIFEREX) 150 MG capsule  . Menthol, Topical Analgesic, (ICY HOT EX)  . Prenatal Vit-Fe Fumarate-FA (PRENATAL MULTIVITAMIN) TABS tablet  . traMADol (ULTRAM) 50 MG tablet   No current facility-administered medications for this encounter.    Myra Gianotti, PA-C Surgical Short Stay/Anesthesiology [facility] Phone [phone] [facility] Phone [phone] 04/17/2020 4:31 PM

## 2020-04-18 ENCOUNTER — Encounter (HOSPITAL_COMMUNITY): Payer: Self-pay | Admitting: Family Medicine

## 2020-04-18 DIAGNOSIS — Z20822 Contact with and (suspected) exposure to covid-19: Secondary | ICD-10-CM | POA: Diagnosis present

## 2020-04-18 DIAGNOSIS — E860 Dehydration: Secondary | ICD-10-CM | POA: Diagnosis present

## 2020-04-18 DIAGNOSIS — Z5189 Encounter for other specified aftercare: Secondary | ICD-10-CM | POA: Diagnosis not present

## 2020-04-18 DIAGNOSIS — N920 Excessive and frequent menstruation with regular cycle: Secondary | ICD-10-CM | POA: Diagnosis present

## 2020-04-18 DIAGNOSIS — N710 Acute inflammatory disease of uterus: Secondary | ICD-10-CM | POA: Diagnosis not present

## 2020-04-18 DIAGNOSIS — R000 Tachycardia, unspecified: Secondary | ICD-10-CM | POA: Diagnosis not present

## 2020-04-18 DIAGNOSIS — Z79899 Other long term (current) drug therapy: Secondary | ICD-10-CM | POA: Diagnosis not present

## 2020-04-18 DIAGNOSIS — A419 Sepsis, unspecified organism: Secondary | ICD-10-CM | POA: Diagnosis present

## 2020-04-18 DIAGNOSIS — G8929 Other chronic pain: Secondary | ICD-10-CM | POA: Diagnosis present

## 2020-04-18 DIAGNOSIS — D500 Iron deficiency anemia secondary to blood loss (chronic): Secondary | ICD-10-CM | POA: Diagnosis present

## 2020-04-18 DIAGNOSIS — E876 Hypokalemia: Secondary | ICD-10-CM | POA: Diagnosis present

## 2020-04-18 DIAGNOSIS — N739 Female pelvic inflammatory disease, unspecified: Secondary | ICD-10-CM | POA: Diagnosis present

## 2020-04-18 DIAGNOSIS — R197 Diarrhea, unspecified: Secondary | ICD-10-CM | POA: Diagnosis present

## 2020-04-18 DIAGNOSIS — D219 Benign neoplasm of connective and other soft tissue, unspecified: Secondary | ICD-10-CM | POA: Diagnosis not present

## 2020-04-18 DIAGNOSIS — D573 Sickle-cell trait: Secondary | ICD-10-CM | POA: Diagnosis present

## 2020-04-18 DIAGNOSIS — R509 Fever, unspecified: Secondary | ICD-10-CM | POA: Diagnosis not present

## 2020-04-18 DIAGNOSIS — D259 Leiomyoma of uterus, unspecified: Secondary | ICD-10-CM | POA: Diagnosis present

## 2020-04-18 DIAGNOSIS — N719 Inflammatory disease of uterus, unspecified: Secondary | ICD-10-CM | POA: Diagnosis present

## 2020-04-18 DIAGNOSIS — Z87891 Personal history of nicotine dependence: Secondary | ICD-10-CM | POA: Diagnosis not present

## 2020-04-18 LAB — BASIC METABOLIC PANEL
Anion gap: 8 (ref 5–15)
BUN: <5 mg/dL — ABNORMAL LOW (ref 6–20)
CO2: 24 mmol/L (ref 22–32)
Calcium: 8.7 mg/dL — ABNORMAL LOW (ref 8.9–10.3)
Chloride: 104 mmol/L (ref 98–111)
Creatinine, Ser: 0.78 mg/dL (ref 0.44–1.00)
GFR calc Af Amer: >60 mL/min (ref >60)
GFR calc non Af Amer: >60 mL/min (ref >60)
Glucose, Bld: 109 mg/dL — ABNORMAL HIGH (ref 70–99)
Potassium: 3.3 mmol/L — ABNORMAL LOW (ref 3.5–5.1)
Sodium: 136 mmol/L (ref 135–145)

## 2020-04-18 LAB — CBC
HCT: 22.8 % — ABNORMAL LOW (ref 36.0–46.0)
Hemoglobin: 7.3 g/dL — ABNORMAL LOW (ref 12.0–15.0)
MCH: 23.3 pg — ABNORMAL LOW (ref 26.0–34.0)
MCHC: 32 g/dL (ref 30.0–36.0)
MCV: 72.8 fL — ABNORMAL LOW (ref 80.0–100.0)
Platelets: 468 10*3/uL — ABNORMAL HIGH (ref 150–400)
RBC: 3.13 MIL/uL — ABNORMAL LOW (ref 3.87–5.11)
RDW: 19.1 % — ABNORMAL HIGH (ref 11.5–15.5)
WBC: 18.7 10*3/uL — ABNORMAL HIGH (ref 4.0–10.5)
nRBC: 0 % (ref 0.0–0.2)

## 2020-04-18 LAB — HEPATIC FUNCTION PANEL
ALT: 13 U/L (ref 0–44)
AST: 21 U/L (ref 15–41)
Albumin: 2.6 g/dL — ABNORMAL LOW (ref 3.5–5.0)
Alkaline Phosphatase: 82 U/L (ref 38–126)
Bilirubin, Direct: 0.3 mg/dL — ABNORMAL HIGH (ref 0.0–0.2)
Indirect Bilirubin: 0.8 mg/dL (ref 0.3–0.9)
Total Bilirubin: 1.1 mg/dL (ref 0.3–1.2)
Total Protein: 8.3 g/dL — ABNORMAL HIGH (ref 6.5–8.1)

## 2020-04-18 LAB — PREPARE RBC (CROSSMATCH)

## 2020-04-18 LAB — MAGNESIUM: Magnesium: 1.8 mg/dL (ref 1.7–2.4)

## 2020-04-18 LAB — HEMOGLOBIN AND HEMATOCRIT, BLOOD
HCT: 29.7 % — ABNORMAL LOW (ref 36.0–46.0)
Hemoglobin: 9.7 g/dL — ABNORMAL LOW (ref 12.0–15.0)

## 2020-04-18 LAB — LIPASE, BLOOD: Lipase: 18 U/L (ref 11–51)

## 2020-04-18 MED ORDER — SODIUM CHLORIDE 0.9% IV SOLUTION: Freq: Once | INTRAVENOUS | Status: AC

## 2020-04-18 MED ORDER — MAGNESIUM SULFATE 2 GM/50ML IV SOLN
2.0000 g | Freq: Once | INTRAVENOUS | Status: AC
  Administered 2020-04-18: 2 g via INTRAVENOUS
  Filled 2020-04-18: qty 50

## 2020-04-18 MED ORDER — ACETAMINOPHEN 650 MG RE SUPP: 650.0000 mg | Freq: Four times a day (QID) | RECTAL | Status: DC | PRN

## 2020-04-18 MED ORDER — ACETAMINOPHEN 325 MG PO TABS
650.0000 mg | ORAL_TABLET | Freq: Four times a day (QID) | ORAL | Status: DC | PRN
  Administered 2020-04-18 – 2020-04-20 (×5): 650 mg via ORAL
  Filled 2020-04-18 (×6): qty 2

## 2020-04-18 MED ORDER — POLYETHYLENE GLYCOL 3350 17 G PO PACK: 17.0000 g | PACK | Freq: Every day | ORAL | Status: DC | PRN

## 2020-04-18 MED ORDER — SODIUM CHLORIDE 0.9% IV SOLUTION: Freq: Once | INTRAVENOUS | Status: DC

## 2020-04-18 MED ORDER — ONDANSETRON HCL 4 MG/2ML IJ SOLN
4.0000 mg | Freq: Four times a day (QID) | INTRAMUSCULAR | Status: DC | PRN
  Administered 2020-04-18 – 2020-04-21 (×3): 4 mg via INTRAVENOUS
  Filled 2020-04-18 (×3): qty 2

## 2020-04-18 MED ORDER — ENSURE ENLIVE PO LIQD
237.0000 mL | Freq: Two times a day (BID) | ORAL | Status: DC
  Administered 2020-04-18 – 2020-04-19 (×2): 237 mL via ORAL

## 2020-04-18 MED ORDER — POTASSIUM CHLORIDE CRYS ER 20 MEQ PO TBCR
20.0000 meq | EXTENDED_RELEASE_TABLET | Freq: Once | ORAL | Status: AC
  Administered 2020-04-18: 20 meq via ORAL
  Filled 2020-04-18: qty 1

## 2020-04-18 MED ORDER — ONDANSETRON HCL 4 MG PO TABS
4.0000 mg | ORAL_TABLET | Freq: Four times a day (QID) | ORAL | Status: DC | PRN
  Administered 2020-04-21: 4 mg via ORAL
  Filled 2020-04-18: qty 1

## 2020-04-18 MED ORDER — SODIUM CHLORIDE 0.9 % IV BOLUS
250.0000 mL | Freq: Once | INTRAVENOUS | Status: AC
  Administered 2020-04-18: 250 mL via INTRAVENOUS

## 2020-04-18 MED ORDER — FLUCONAZOLE 100 MG PO TABS
200.0000 mg | ORAL_TABLET | Freq: Every day | ORAL | Status: DC
  Administered 2020-04-18 – 2020-04-19 (×2): 200 mg via ORAL
  Filled 2020-04-18 (×2): qty 2

## 2020-04-18 MED ORDER — ADULT MULTIVITAMIN W/MINERALS CH
1.0000 | ORAL_TABLET | Freq: Every day | ORAL | Status: DC
  Administered 2020-04-19 – 2020-04-20 (×2): 1 via ORAL
  Filled 2020-04-18 (×2): qty 1

## 2020-04-18 MED ORDER — LACTATED RINGERS IV SOLN: INTRAVENOUS | Status: DC

## 2020-04-18 MED ORDER — PIPERACILLIN-TAZOBACTAM 3.375 G IVPB
3.3750 g | Freq: Three times a day (TID) | INTRAVENOUS | Status: DC
  Administered 2020-04-18 – 2020-04-19 (×5): 3.375 g via INTRAVENOUS
  Filled 2020-04-18 (×7): qty 50

## 2020-04-18 NOTE — Progress Notes (Signed)
Initial Nutrition Assessment  RD working remotely.  DOCUMENTATION CODES:   Not applicable, suspect malnutrition but RD unable to confirm without NFPE  INTERVENTION:   - Ensure Enlive po BID, each supplement provides 350 kcal and 20 grams of protein  - Provided diet education regarding ways to increase calories and protein, handout from the Academy [employer] attached to pt's AVS  - MVI with minerals daily  NUTRITION DIAGNOSIS:   Inadequate oral intake related to poor appetite, early satiety as evidenced by per patient/family report.  GOAL:   Patient will meet greater than or equal to 90% of their needs  MONITOR:   PO intake, Supplement acceptance, Labs, Weight trends  REASON FOR ASSESSMENT:   Malnutrition Screening Tool    ASSESSMENT:   34 year old female who presented on 7/30 for pre-op testing for uterine myomectomy. PMH of miscarriage in May 2021, depression, anxiety, anemia. Pt admitted with sepsis.   Spoke with pt via phone call to room. Pt reports having a decreased/poor appetite since her miscarriage on 02/11/20. Pt reports that her appetite and PO intake have not improved. Pt shares that she finds the smell of food unattractive and often "gags" when trying to eat. Pt wondering if she may have acid reflux. RD encouraged pt to discuss with MD.  Pt endorses food having a metallic taste that began after her miscarriage. Pt reports that this has improved somewhat.  Pt reports that over the last 3 months, she has been eating about 1 time a day. Over the last week, she has started snacking on fruit in addition to the 1 time a day of eating. For pt's one meal, she may consume a smoothie with added protein and vitamins or fish (catfish or salmon) with fruit. Pt has been drinking water, Vitamin Water, and ginger ale. Pt shares that her wife has been ordering her food and that she is able to eat only 4-5 bites before she feels full and is unable to eat more.  Pt  reports that so far today, she has consumed applesauce, pretzels, water, and ginger ale. Pt has a fruit cup from her lunch meal tray that she was going to try to consume.  RD provided "High-Calorie High-Protein Nutrition Therapy" handout from the Academy of [employer]. Handout is attached to pt's AVS/Discharge Instructions. Provided examples on ways to increase caloric density of foods and beverages frequently consumed by the patient. Also provided ideas to promote variety and to incorporate additional nutrient dense foods into patient's diet. Discussed eating small frequent meals and snacks to assist in increasing overall PO intake.  Pt endorses weight loss over the last 3 months. She reports that recently she has been maintaining her weight around 199-200 lbs but that a UBW for her is 175 lbs. She reports that the lowest she has weighed is 150 lbs but this was when she was very active with work.  Reviewed weight history in chart. Pt with a 13.1 kg weight loss since 01/24/20. This is a 14.6% weight loss in less than 3 months which is severe and significant for timeframe. Strongly suspect pt with malnutrition but unable to confirm at this time without NFPE.  Pt amenable to trying Ensure Enlive supplements during admission to aid in meeting kcal and protein needs. She had been considering purchasing some to drink at home. RD encouraged pt to continue supplement consumption after discharge.  Meal Completion: 0-55% x 2 documented meals  Medications reviewed and include: IV abx IVF: LR @  100 ml/hr  Labs reviewed: potassium 3.3, hemoglobin 7.3  NUTRITION - FOCUSED PHYSICAL EXAM:  Unable to complete at this time. RD working remotely.  Diet Order:   Diet Order            Diet regular Room service appropriate? Yes; Fluid consistency: Thin  Diet effective now                 EDUCATION NEEDS:   Education needs have been addressed  Skin:  Skin Assessment: Reviewed RN  Assessment  Last BM:  04/17/20  Height:   Ht Readings from Last 1 Encounters:  04/17/20 5\' 7"  (1.702 m)    Weight:   Wt Readings from Last 1 Encounters:  04/17/20 76.7 kg    Ideal Body Weight:  61.4 kg  BMI:  Body mass index is 26.47 kg/m.  Estimated Nutritional Needs:   Kcal:  2000-2200  Protein:  95-110 grams  Fluid:  >/= 2.0 L    Gaynell Face, MS, RD, LDN Inpatient Clinical Dietitian Please see AMiON for contact information.

## 2020-04-18 NOTE — Progress Notes (Addendum)
PROGRESS NOTE    Sandra Gregory  QHU:[id number] DOB: 1986/06/07 DOA: 04/17/2020 PCP: Default, Provider, MD   Brief Narrative: 34 year old with past medical history of miscarriage me 25, since then she cannot stand for long time because of dizziness.  She report lightheadedness.  She has been feeling weak, she report weight loss.  She report shortness of breath with activity.  She presented today for preop testing for myomectomy.  Patient was found to be tachycardic heart rate up to 140, temperature 102.  She was sent to the ER.  She met sepsis criteria.  White blood cell count of 21 and hemoglobin of 6.7.  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  She has several large fibroids with up to 12 cm long.    Assessment & Plan:   Principal Problem:   Fever Active Problems:   Tachycardia   Iron deficiency anemia due to chronic blood loss   Symptomatic anemia  1-Symptomatic anemia due to iron deficiency likely from menorrhagia: 2 units of packed red blood cells ordered. Iron 26, ferritin 170 She will need IV iron when source of infection is controlled.  Patient to received second unit of blood.  Check bilirubin due to concern for yellowish color of  eyes  2-Sepsis: Patient presented with fever tachycardia leukocytosis. Covid 19 Negative.  X-ray: Negative UA; moderate leukocytes, white blood cells 6-10. Pelvis ultrasound: Enlarged myomatous uterus, unremarkable right ovary, nonvisualization of the endometrium and left ovary. Urine culture: pending Blood culture; pending Suspect related to UTI Report vaginal discharge. Will give diflucan for 3 days.  White blood cell trending down from 21-18  3-Hypokalemia: replete orally.   4-Fibroid menorrhagia: Per OB Abdominal pain thought to be related to fibroid.  Will check lipase per family request  Estimated body mass index is 26.47 kg/m as calculated from the following:   Height as of this encounter: _0  (1.702 m).   Weight as of this  encounter: 76.7 kg.   DVT prophylaxis: SCD Code Status: SCDs Family Communication: Wife at bedside Disposition Plan:  Status is: Inpatient  Dispo: The patient is from: Home              Anticipated d/c is to: Home              Anticipated d/c date is: 2 days              Patient currently is not medically stable to d/c.        Consultants:   None  Procedures:   None  Antimicrobials:    Subjective: She is alert and conversant.  She is feeling better last week.  She denies any vaginal bleeding today, she had some bleeding yesterday.  She reports some pinkish-yellowish  Discharge.  Wife is concerned about mild yellow eyes colors .  Patient reports abdominal pain from fibroid, accompanied with nausea. Patient had a mild fever prior to second unit of blood.  Will treat fevers prior to blood transfusion  Objective: Vitals:   04/18/20 0136 04/18/20 0310 04/18/20 0345 04/18/20 0729  BP: 110/74 (!) 95/50 102/68 116/77  Pulse: (!) 110 (!) 113 100 (!) 107  Resp: _1 Temp: 98.9 F (37.2 C) 99.1 F (37.3 C) 99 F (37.2 C)   TempSrc: Oral Oral Oral   SpO2: 100% 95% 100% 100%  Weight:      Height:        Intake/Output Summary (Last 24 hours) at 04/18/2020 4656 Last data filed at  04/18/2020 0300 Gross per 24 hour  Intake 494.62 ml  Output --  Net 494.62 ml   Filed Weights   04/17/20 1508  Weight: 76.7 kg    Examination:  General exam: Appears calm and comfortable  Respiratory system: Clear to auscultation. Respiratory effort normal. Cardiovascular system: S1 & S2 heard, RRR. No JVD, murmurs, rubs, gallops or clicks. No pedal edema. Gastrointestinal system: Abdomen is nondistended, soft and nontender. No organomegaly or masses felt. Normal bowel sounds heard. Central nervous system: Alert and oriented. No focal neurological deficits. Extremities: Symmetric 5 x 5 power. Skin: No rashes, lesions or ulcers   Data Reviewed: I have personally reviewed  following labs and imaging studies  CBC: Recent Labs  Lab 04/17/20 1320 04/17/20 1645  WBC 23.4* 21.8*  NEUTROABS  --  18.9*  HGB 7.0* 6.7*  HCT 22.5* 22.0*  MCV 73.8* 75.1*  PLT 565* 161*   Basic Metabolic Panel: Recent Labs  Lab 04/17/20 1321 04/17/20 1645  NA 133* 130*  K 3.0* 3.1*  CL 97* 95*  CO2 23 21*  GLUCOSE 115* 97  BUN 5* 5*  CREATININE 0.95 0.94  CALCIUM 9.0 8.9   GFR: Estimated Creatinine Clearance: 91.7 mL/min (by C-G formula based on SCr of 0.94 mg/dL). Liver Function Tests: Recent Labs  Lab 04/17/20 1645  AST 20  ALT 15  ALKPHOS 77  BILITOT 0.8  PROT 8.0  ALBUMIN 2.7*   No results for input(s): LIPASE, AMYLASE in the last 168 hours. No results for input(s): AMMONIA in the last 168 hours. Coagulation Profile: Recent Labs  Lab 04/17/20 1645  INR 1.2   Cardiac Enzymes: No results for input(s): CKTOTAL, CKMB, CKMBINDEX, TROPONINI in the last 168 hours. BNP (last 3 results) No results for input(s): PROBNP in the last 8760 hours. HbA1C: No results for input(s): HGBA1C in the last 72 hours. CBG: No results for input(s): GLUCAP in the last 168 hours. Lipid Profile: No results for input(s): CHOL, HDL, LDLCALC, TRIG, CHOLHDL, LDLDIRECT in the last 72 hours. Thyroid Function Tests: No results for input(s): TSH, T4TOTAL, FREET4, T3FREE, THYROIDAB in the last 72 hours. Anemia Panel: Recent Labs    04/17/20 1955 04/17/20 1958  VITAMINB12 618  --   FOLATE 10.5  --   FERRITIN 170  --   TIBC 195*  --   IRON 26*  --   RETICCTPCT  --  1.3   Sepsis Labs: Recent Labs  Lab 04/17/20 1618 04/17/20 1958  LATICACIDVEN 1.4 1.1    Recent Results (from the past 240 hour(s))  Wet prep, genital     Status: Abnormal   Collection Time: 04/17/20  7:13 PM  Result Value Ref Range Status   Yeast Wet Prep HPF POC NONE SEEN NONE SEEN Final   Trich, Wet Prep NONE SEEN NONE SEEN Final   Clue Cells Wet Prep HPF POC NONE SEEN NONE SEEN Final   WBC, Wet  Prep HPF POC FEW (A) NONE SEEN Final   Sperm NONE SEEN  Final    Comment: Performed at [facility], [place] [address], [place], [place] [address]  SARS Coronavirus 2 by RT PCR (hospital order, performed in [facility] hospital lab) Nasopharyngeal Nasopharyngeal Swab     Status: None   Collection Time: 04/17/20  7:55 PM   Specimen: Nasopharyngeal Swab  Result Value Ref Range Status   SARS Coronavirus 2 NEGATIVE NEGATIVE Final    Comment: (NOTE) SARS-CoV-2 target nucleic acids are NOT DETECTED.  The SARS-CoV-2  RNA is generally detectable in upper and lower respiratory specimens during the acute phase of infection. The lowest concentration of SARS-CoV-2 viral copies this assay can detect is 250 copies / mL. A negative result does not preclude SARS-CoV-2 infection and should not be used as the sole basis for treatment or other patient management decisions.  A negative result may occur with improper specimen collection / handling, submission of specimen other than nasopharyngeal swab, presence of viral mutation(s) within the areas targeted by this assay, and inadequate number of viral copies (<250 copies / mL). A negative result must be combined with clinical observations, patient history, and epidemiological information.  Fact Sheet for Patients:   StrictlyIdeas.no  Fact Sheet for Healthcare Providers: BankingDealers.co.za  This test is not yet approved or  cleared by the [place] FDA and has been authorized for detection and/or diagnosis of SARS-CoV-2 by FDA under an Emergency Use Authorization (EUA).  This EUA will remain in effect (meaning this test can be used) for the duration of the COVID-19 declaration under Section 564(b)(1) of the Act, 21 U.S.C. section 360bbb-3(b)(1), unless the authorization is terminated or revoked sooner.  Performed at [facility], [place] [address], [place], [place] [address]           Radiology Studies: DG Chest 2 View  Result Date: 04/17/2020 CLINICAL DATA:  Shortness of breath.  Dizziness. EXAM: CHEST - 2 VIEW COMPARISON:  None. FINDINGS: The cardiomediastinal contours are normal. The lungs are clear. Pulmonary vasculature is normal. No consolidation, pleural effusion, or pneumothorax. No acute osseous abnormalities are seen. IMPRESSION: Negative radiographs of the chest. Electronically Signed   By: Keith Rake M.D.   On: 04/17/2020 14:52   US Pelvis Complete  Result Date: 04/17/2020 CLINICAL DATA:  34 year old female with vaginal discharge. EXAM: TRANSABDOMINAL ULTRASOUND OF PELVIS TECHNIQUE: Transabdominal ultrasound examination of the pelvis was performed including evaluation of the uterus, ovaries, adnexal regions, and pelvic cul-de-sac. COMPARISON:  Pelvic ultrasound dated 02/11/2020. FINDINGS: Uterus Measurements: 15.7 x 10.8 x 14.5 cm = volume: 1273 mL. The uterus is enlarged, heterogeneous, and myomatous. The uterus is poorly visualized due to shadowing called by calcified fibroid. There is a 12.2 x 9.4 x 12.4 cm upper body or fundal intramural fibroid with probable submucosal component. A 7.9 x 6.3 x 8.6 cm partially exophytic posterior body fibroid is also noted. Several additional fibroids are likely present but poorly visualized. Endometrium The endometrium is not visualized. Right ovary Measurements: 3.0 x 1.7 x 2.6 cm = volume: 7.1 mL. Normal appearance/no adnexal mass. Left ovary The left ovary is not visualized. Other findings:  No abnormal free fluid. IMPRESSION: 1. Enlarged myomatous uterus. 2. Unremarkable right ovary. 3. Nonvisualization of the endometrium and the left ovary. Electronically Signed   By: Anner Crete M.D.   On: 04/17/2020 20:36        Scheduled Meds: . sodium chloride   Intravenous Once   Continuous Infusions: . lactated ringers    . piperacillin-tazobactam 3.375 g (04/18/20 0423)     LOS: 0 days    Time spent: 35  minutes.     Elmarie Shiley, MD [facility]   If 7PM-7AM, please contact night-coverage [email or web address]  04/18/2020, 7:31 AM

## 2020-04-18 NOTE — Progress Notes (Signed)
New Admission Note:   Arrival Method: Stretcher Mental Orientation: alert x 4 Telemetry: box 16 Assessment: Completed Skin: intact IV: two  NSL Pain: none Tubes: none Safety Measures: Safety Fall Prevention Plan has been discussed Admission: Completed [facility] Orientation: Patient has been orientated to the room, unit and staff.  Family: none at bedside  Orders have been reviewed and implemented. Will continue to monitor the patient. Call light has been placed within reach and bed alarm has been activated.   Rockie Neighbours BSN, RN Phone number: [phone]

## 2020-04-18 NOTE — Progress Notes (Signed)
Notified MD Regalado in person that the pt's oral temp was 100.31F. Per MD to give PRN Tylenol and hold PRBCs transfusion. Will recheck oral temp in a hour.    Paulla Fore, RN, BSN

## 2020-04-18 NOTE — Progress Notes (Addendum)
   04/18/20 1644  Assess: MEWS Score  Temp (!) 101 F (38.3 C)  BP 111/67  Pulse Rate (!) 107  Resp 17  SpO2 98 %  O2 Device Room Air  Assess: MEWS Score  MEWS Temp 1  MEWS Systolic 0  MEWS Pulse 1  MEWS RR 0  MEWS LOC 0  MEWS Score 2  MEWS Score Color Yellow  Assess: if the MEWS score is Yellow or Red  Were vital signs taken at a resting state? Yes  Focused Assessment No change from prior assessment  Early Detection of Sepsis Score *See Row Information* Low  MEWS guidelines implemented *See Row Information* No, vital signs rechecked  Treat  MEWS Interventions Administered scheduled meds/treatments;Administered prn meds/treatments  Pain Scale 0-10

## 2020-04-18 NOTE — ED Notes (Signed)
Recent patient's blood sample to blood bank.

## 2020-04-19 ENCOUNTER — Encounter (HOSPITAL_COMMUNITY): Payer: Self-pay | Admitting: Family Medicine

## 2020-04-19 ENCOUNTER — Inpatient Hospital Stay (HOSPITAL_COMMUNITY): Payer: 59

## 2020-04-19 DIAGNOSIS — N710 Acute inflammatory disease of uterus: Secondary | ICD-10-CM

## 2020-04-19 LAB — BASIC METABOLIC PANEL
Anion gap: 12 (ref 5–15)
BUN: <5 mg/dL — ABNORMAL LOW (ref 6–20)
CO2: 21 mmol/L — ABNORMAL LOW (ref 22–32)
Calcium: 8.8 mg/dL — ABNORMAL LOW (ref 8.9–10.3)
Chloride: 103 mmol/L (ref 98–111)
Creatinine, Ser: 0.93 mg/dL (ref 0.44–1.00)
GFR calc Af Amer: >60 mL/min (ref >60)
GFR calc non Af Amer: >60 mL/min (ref >60)
Glucose, Bld: 126 mg/dL — ABNORMAL HIGH (ref 70–99)
Potassium: 3.3 mmol/L — ABNORMAL LOW (ref 3.5–5.1)
Sodium: 136 mmol/L (ref 135–145)

## 2020-04-19 LAB — URINE CULTURE

## 2020-04-19 LAB — CBC
HCT: 26.5 % — ABNORMAL LOW (ref 36.0–46.0)
Hemoglobin: 8.7 g/dL — ABNORMAL LOW (ref 12.0–15.0)
MCH: 24.2 pg — ABNORMAL LOW (ref 26.0–34.0)
MCHC: 32.8 g/dL (ref 30.0–36.0)
MCV: 73.8 fL — ABNORMAL LOW (ref 80.0–100.0)
Platelets: 475 10*3/uL — ABNORMAL HIGH (ref 150–400)
RBC: 3.59 MIL/uL — ABNORMAL LOW (ref 3.87–5.11)
RDW: 19.3 % — ABNORMAL HIGH (ref 11.5–15.5)
WBC: 28.8 10*3/uL — ABNORMAL HIGH (ref 4.0–10.5)
nRBC: 0 % (ref 0.0–0.2)

## 2020-04-19 LAB — HEMOGLOBIN AND HEMATOCRIT, BLOOD
HCT: 23.6 % — ABNORMAL LOW (ref 36.0–46.0)
Hemoglobin: 7.8 g/dL — ABNORMAL LOW (ref 12.0–15.0)

## 2020-04-19 LAB — C DIFFICILE QUICK SCREEN W PCR REFLEX
C Diff antigen: NEGATIVE
C Diff interpretation: NOT DETECTED
C Diff toxin: NEGATIVE

## 2020-04-19 LAB — LACTIC ACID, PLASMA: Lactic Acid, Venous: 1.1 mmol/L (ref 0.5–1.9)

## 2020-04-19 MED ORDER — DOXYCYCLINE HYCLATE 100 MG PO TABS: 100.0000 mg | ORAL_TABLET | Freq: Two times a day (BID) | ORAL | Status: DC

## 2020-04-19 MED ORDER — TRAMADOL HCL 50 MG PO TABS
50.0000 mg | ORAL_TABLET | Freq: Once | ORAL | Status: AC
  Administered 2020-04-19: 50 mg via ORAL
  Filled 2020-04-19: qty 1

## 2020-04-19 MED ORDER — SODIUM CHLORIDE 0.9 % IV SOLN: INTRAVENOUS | Status: DC

## 2020-04-19 MED ORDER — METRONIDAZOLE IN NACL 5-0.79 MG/ML-% IV SOLN
500.0000 mg | Freq: Three times a day (TID) | INTRAVENOUS | Status: DC
  Administered 2020-04-19 – 2020-04-22 (×8): 500 mg via INTRAVENOUS
  Filled 2020-04-19 (×8): qty 100

## 2020-04-19 MED ORDER — BOOST / RESOURCE BREEZE PO LIQD CUSTOM
1.0000 | Freq: Two times a day (BID) | ORAL | Status: DC
  Administered 2020-04-19 – 2020-04-21 (×3): 1 via ORAL

## 2020-04-19 MED ORDER — SODIUM CHLORIDE 0.9 % IV SOLN
2.0000 g | INTRAVENOUS | Status: DC
  Administered 2020-04-19 – 2020-04-21 (×3): 2 g via INTRAVENOUS
  Filled 2020-04-19 (×2): qty 20
  Filled 2020-04-19: qty 2
  Filled 2020-04-19: qty 20

## 2020-04-19 MED ORDER — VANCOMYCIN HCL IN DEXTROSE 1-5 GM/200ML-% IV SOLN
1000.0000 mg | Freq: Three times a day (TID) | INTRAVENOUS | Status: DC
  Filled 2020-04-19 (×2): qty 200

## 2020-04-19 MED ORDER — POTASSIUM CHLORIDE CRYS ER 20 MEQ PO TBCR
40.0000 meq | EXTENDED_RELEASE_TABLET | Freq: Once | ORAL | Status: AC
  Administered 2020-04-19: 40 meq via ORAL
  Filled 2020-04-19: qty 2

## 2020-04-19 MED ORDER — FERROUS SULFATE 325 (65 FE) MG PO TABS
325.0000 mg | ORAL_TABLET | Freq: Every day | ORAL | Status: DC
  Administered 2020-04-19 – 2020-04-20 (×2): 325 mg via ORAL
  Filled 2020-04-19 (×2): qty 1

## 2020-04-19 MED ORDER — SODIUM CHLORIDE 0.9 % IV BOLUS
500.0000 mL | Freq: Once | INTRAVENOUS | Status: AC
  Administered 2020-04-19: 500 mL via INTRAVENOUS

## 2020-04-19 MED ORDER — IOHEXOL 300 MG/ML  SOLN
100.0000 mL | Freq: Once | INTRAMUSCULAR | Status: AC | PRN
  Administered 2020-04-19: 100 mL via INTRAVENOUS

## 2020-04-19 MED ORDER — VANCOMYCIN HCL 1750 MG/350ML IV SOLN
1750.0000 mg | Freq: Once | INTRAVENOUS | Status: AC
  Administered 2020-04-19: 1750 mg via INTRAVENOUS
  Filled 2020-04-19: qty 350

## 2020-04-19 NOTE — Consult Note (Signed)
[facility]  Total days of antibiotics 3        Day 3 vanco/piptazo       Reason for Consult:leukocytosis   Referring Physician: regalado  Principal Problem:   Fever Active Problems:   Fibroids   Tachycardia   Iron deficiency anemia due to chronic blood loss   Symptomatic anemia    HPI: Sandra Gregory is a 34 y.o. female with history of sickle cell trait, symptomatic anemia for uterine fibroids was admitted on 7/30 as referral from pre op testing when she came in to clinic for labs for her upcoming myomectomy where she was found to have SIRS, temp of 102, HR of 140. She reports recent miscarriage at [redacted] wks gestation in may and  still having menstrual as well as breakthrough bleeding with symptomatic anemia, hgb 6.7. CT showing enlarged uterus of 22 x 16 x 9.4cm. dominant fibroid in lower uterine area of 10 x 10 x 9.5cm. with intraluminal air. She was started on broad spectrum abtx. Gyn to evaluate. Despite starting abtx her fever curve only now starting to trend down but her wbc increased from 18K to 28K. Having occasional loose stool. UA showing rare bacteria few wbcs and blood cx ngtd at 48hr.  She and her wife also have 36 yo daughter. Everyone is health. She didn't realize she had fever, though she feels tachycardic when she is up.   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 Anemia   . Anxiety   . Depression   . 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 sodium chloride   Intravenous Once  . sodium chloride   Intravenous Once  . feeding supplement  1 Container Oral BID BM  . ferrous sulfate  325 mg Oral Q breakfast  . multivitamin with minerals  1 tablet Oral Daily    Social History   Tobacco Use  . Smoking status: Former Smoker    Types: Cigarettes    Quit date: 11/22/2019    Years since quitting: 0.4  . Smokeless tobacco: Never Used  Vaping Use  . Vaping Use: Never  used  Substance Use Topics  . Alcohol use: Yes    Alcohol/week: 1.0 standard drink    Types: 1 Glasses of wine per week    Comment: social  . Drug use: Not Currently    Types: Marijuana    Comment: not since pregnancy    Family History  Problem Relation Age of Onset  . Cancer Maternal Grandmother        breast  . Diabetes Maternal Grandmother   . Hypertension Maternal Grandmother   . Diabetes Maternal Grandfather   . Hypertension Maternal Grandfather      Review of Systems  Constitutional: Negative for fever, chills, diaphoresis, activity change, appetite change, fatigue and unexpected weight change.  HENT: Negative for congestion, sore throat, rhinorrhea, sneezing, trouble swallowing and sinus pressure.  Eyes: Negative for photophobia and visual disturbance.  Respiratory: Negative for cough, chest tightness, shortness of breath, wheezing and stridor.  Cardiovascular: Negative for chest pain, palpitations and leg swelling.  Gastrointestinal: Negative for nausea, vomiting, abdominal pain, diarrhea, constipation, blood in stool, abdominal distention and anal bleeding.  Genitourinary: Negative for dysuria, hematuria, flank pain and difficulty urinating.  Musculoskeletal: Negative for myalgias, back pain, joint swelling, arthralgias and gait problem.  Skin: Negative for color change, pallor, rash and wound.  Neurological:  Negative for dizziness, tremors, weakness and light-headedness.  Hematological: Negative for adenopathy. Does not bruise/bleed easily.  Psychiatric/Behavioral: Negative for behavioral problems, confusion, sleep disturbance, dysphoric mood, decreased concentration and agitation.     OBJECTIVE: Temp:  [99.1 F (37.3 C)-100.1 F (37.8 C)] 100 F (37.8 C) (08/01 1258) Pulse Rate:  [79-108] 79 (08/01 1258) Resp:  [18] 18 (08/01 1258) BP: (96-113)/(59-80) 107/76 (08/01 1258) SpO2:  [98 %-100 %] 100 % (08/01 1258) Weight:  [81.8 kg] 81.8 kg (07/31 2055) Physical  Exam  Constitutional:  oriented to person, place, and time. appears well-developed and well-nourished. No distress.  HENT: [place]/AT, PERRLA, no scleral icterus Mouth/Throat: Oropharynx is clear and moist. No oropharyngeal exudate.  Cardiovascular: Normal rate, regular rhythm and normal heart sounds. Exam reveals no gallop and no friction rub.  No murmur heard.  Pulmonary/Chest: Effort normal and breath sounds normal. No respiratory distress.  has no wheezes.  Neck = supple, no nuchal rigidity Abdominal: Soft. Bowel sounds are normal.  exhibits no distension. There is no tenderness.  Lymphadenopathy: no cervical adenopathy. No axillary adenopathy Neurological: alert and oriented to person, place, and time.  Skin: Skin is warm and dry. No rash noted. No erythema.  Psychiatric: a normal mood and affect.  behavior is normal.    LABS: Results for orders placed or performed during the hospital encounter of 04/17/20 (from the past 48 hour(s))  Urinalysis, Routine w reflex microscopic     Status: Abnormal   Collection Time: 04/17/20  6:58 PM  Result Value Ref Range   Color, Urine YELLOW YELLOW   APPearance CLEAR CLEAR   Specific Gravity, Urine 1.009 1.005 - 1.030   pH 6.0 5.0 - 8.0   Glucose, UA NEGATIVE NEGATIVE mg/dL   Hgb urine dipstick MODERATE (A) NEGATIVE   Bilirubin Urine NEGATIVE NEGATIVE   Ketones, ur NEGATIVE NEGATIVE mg/dL   Protein, ur NEGATIVE NEGATIVE mg/dL   Nitrite NEGATIVE NEGATIVE   Leukocytes,Ua MODERATE (A) NEGATIVE   RBC / HPF 6-10 0 - 5 RBC/hpf   WBC, UA 6-10 0 - 5 WBC/hpf   Bacteria, UA RARE (A) NONE SEEN   Squamous Epithelial / LPF 0-5 0 - 5    Comment: Performed at [facility], [address], [place], [place] [address]  Urine culture     Status: Abnormal   Collection Time: 04/17/20  6:58 PM   Specimen: In/Out Cath Urine  Result Value Ref Range   Specimen Description IN/OUT CATH URINE    Special Requests      NONE Performed at [facility],  [place] [address], [place], [place] [address]    Culture MULTIPLE SPECIES PRESENT, SUGGEST RECOLLECTION (A)    Report Status 04/19/2020 FINAL   Wet prep, genital     Status: Abnormal   Collection Time: 04/17/20  7:13 PM  Result Value Ref Range   Yeast Wet Prep HPF POC NONE SEEN NONE SEEN   Trich, Wet Prep NONE SEEN NONE SEEN   Clue Cells Wet Prep HPF POC NONE SEEN NONE SEEN   WBC, Wet Prep HPF POC FEW (A) NONE SEEN   Sperm NONE SEEN     Comment: Performed at [facility], [place] [address] [place],  [address]  SARS Coronavirus 2 by RT PCR (hospital order, performed in [facility] hospital lab) Nasopharyngeal Nasopharyngeal Swab     Status: None   Collection Time: 04/17/20  7:55 PM   Specimen: Nasopharyngeal Swab  Result Value Ref Range  SARS Coronavirus 2 NEGATIVE NEGATIVE    Comment: (NOTE) SARS-CoV-2 target nucleic acids are NOT DETECTED.  The SARS-CoV-2 RNA is generally detectable in upper and lower respiratory specimens during the acute phase of infection. The lowest concentration of SARS-CoV-2 viral copies this assay can detect is 250 copies / mL. A negative result does not preclude SARS-CoV-2 infection and should not be used as the sole basis for treatment or other patient management decisions.  A negative result may occur with improper specimen collection / handling, submission of specimen other than nasopharyngeal swab, presence of viral mutation(s) within the areas targeted by this assay, and inadequate number of viral copies (<250 copies / mL). A negative result must be combined with clinical observations, patient history, and epidemiological information.  Fact Sheet for Patients:   StrictlyIdeas.no  Fact Sheet for Healthcare Providers: BankingDealers.co.za  This test is not yet approved or  cleared by the [place] FDA and has been authorized for detection and/or diagnosis of SARS-CoV-2 by FDA under an Emergency  Use Authorization (EUA).  This EUA will remain in effect (meaning this test can be used) for the duration of the COVID-19 declaration under Section 564(b)(1) of the Act, 21 U.S.C. section 360bbb-3(b)(1), unless the authorization is terminated or revoked sooner.  Performed at [facility], [place] [address], [place], [place] [address]   Vitamin B12     Status: None   Collection Time: 04/17/20  7:55 PM  Result Value Ref Range   Vitamin B-12 618 180 - 914 pg/mL    Comment: (NOTE) This assay is not validated for testing neonatal or myeloproliferative syndrome specimens for Vitamin B12 levels. Performed at [facility], [place] [address], [place], [place] [address]   Iron and TIBC     Status: Abnormal   Collection Time: 04/17/20  7:55 PM  Result Value Ref Range   Iron 26 (L) 28 - 170 ug/dL   TIBC 195 (L) 250 - 450 ug/dL   Saturation Ratios 13 10.4 - 31.8 %   UIBC 169 ug/dL    Comment: Performed at [facility], [place] [address] [place], [place] [address]  Ferritin     Status: None   Collection Time: 04/17/20  7:55 PM  Result Value Ref Range   Ferritin 170 11 - 307 ng/mL    Comment: Performed at [facility], [place] [address], [place], [place] [address]  Folate     Status: None   Collection Time: 04/17/20  7:55 PM  Result Value Ref Range   Folate 10.5 >5.9 ng/mL    Comment: Performed at [facility], [place] [address], [place], [place] [address]  Lactic acid, plasma     Status: None   Collection Time: 04/17/20  7:58 PM  Result Value Ref Range   Lactic Acid, Venous 1.1 0.5 - 1.9 mmol/L    Comment: Performed at [facility] [address] [place], [place] [address]  Reticulocytes     Status: Abnormal   Collection Time: 04/17/20  7:58 PM  Result Value Ref Range   Retic Ct Pct 1.3 0.4 - 3.1 %   RBC. 2.90 (L) 3.87 - 5.11 MIL/uL   Retic Count, Absolute 37.1 19.0 - 186.0 K/uL   Immature Retic Fract 34.0 (H) 2.3 - 15.9 %    Comment: Performed at [facility] [address], [place], [place] [address]  Prepare RBC (crossmatch)     Status: None  Collection Time: 04/18/20  1:06 AM  Result Value Ref Range   Order Confirmation      ORDER PROCESSED BY BLOOD BANK Performed at [facility], [place] [address] [place], [place] [address]   Type and screen [place]     Status: None   Collection Time: 04/18/20  1:06 AM  Result Value Ref Range   ABO/RH(D) A POS    Antibody Screen NEG    Sample Expiration 04/21/2020,2359    Unit Number [id number]    Blood Component Type RED CELLS,LR    Unit division 00    Status of Unit ISSUED,FINAL    Transfusion Status OK TO TRANSFUSE    Crossmatch Result      Compatible Performed at [facility], [place] [address], [place], [place] [address]    Unit Number [id number]    Blood Component Type RED CELLS,LR    Unit division 00    Status of Unit ISSUED,FINAL    Transfusion Status OK TO TRANSFUSE    Crossmatch Result Compatible   Prepare RBC (crossmatch)     Status: None   Collection Time: 04/18/20  1:31 AM  Result Value Ref Range   Order Confirmation      ORDER PROCESSED BY BLOOD BANK Performed at [facility], [place] [address], [place], [place] [address]   Culture, blood (single)     Status: None (Preliminary result)   Collection Time: 04/18/20  2:38 AM   Specimen: BLOOD  Result Value Ref Range   Specimen Description BLOOD RIGHT FOREARM    Special Requests      BOTTLES DRAWN AEROBIC AND ANAEROBIC Blood Culture adequate volume   Culture      NO GROWTH 1 DAY Performed at [facility], [place] [address] [place], [place] [address]    Report Status PENDING   Prepare RBC (crossmatch)     Status: None   Collection Time: 04/18/20  7:58 AM  Result Value Ref Range   Order Confirmation      ORDER PROCESSED BY BLOOD BANK Performed at [facility], [place] [address] [place], [place] [address]   Basic metabolic panel     Status: Abnormal   Collection Time:  04/18/20  8:57 AM  Result Value Ref Range   Sodium 136 135 - 145 mmol/L   Potassium 3.3 (L) 3.5 - 5.1 mmol/L   Chloride 104 98 - 111 mmol/L   CO2 24 22 - 32 mmol/L   Glucose, Bld 109 (H) 70 - 99 mg/dL    Comment: Glucose reference range applies only to samples taken after fasting for at least 8 hours.   BUN <5 (L) 6 - 20 mg/dL   Creatinine, Ser 0.78 0.44 - 1.00 mg/dL   Calcium 8.7 (L) 8.9 - 10.3 mg/dL   GFR calc non Af Amer >60 >60 mL/min   GFR calc Af Amer >60 >60 mL/min   Anion gap 8 5 - 15    Comment: Performed at [facility] [address] [place], [place] [address]  Magnesium     Status: None   Collection Time: 04/18/20  8:57 AM  Result Value Ref Range   Magnesium 1.8 1.7 - 2.4 mg/dL    Comment: Performed at [facility] [address], [place],  [address]  CBC     Status: Abnormal   Collection Time: 04/18/20  8:57 AM  Result Value Ref Range   WBC  18.7 (H) 4.0 - 10.5 K/uL   RBC 3.13 (L) 3.87 - 5.11 MIL/uL   Hemoglobin 7.3 (L) 12.0 - 15.0 g/dL    Comment: Reticulocyte Hemoglobin testing may be clinically indicated, consider ordering this additional test [id number]    HCT 22.8 (L) 36 - 46 %   MCV 72.8 (L) 80.0 - 100.0 fL   MCH 23.3 (L) 26.0 - 34.0 pg   MCHC 32.0 30.0 - 36.0 g/dL   RDW 19.1 (H) 11.5 - 15.5 %   Platelets 468 (H) 150 - 400 K/uL   nRBC 0.0 0.0 - 0.2 %    Comment: Performed at [facility], [place] [address] [place], [place] [address]  Hemoglobin and hematocrit, blood     Status: Abnormal   Collection Time: 04/18/20  8:18 PM  Result Value Ref Range   Hemoglobin 9.7 (L) 12.0 - 15.0 g/dL    Comment: REPEATED TO VERIFY POST TRANSFUSION SPECIMEN    HCT 29.7 (L) 36 - 46 %    Comment: Performed at [facility] [address] [place], [place] [address]  Lipase, blood     Status: None   Collection Time: 04/18/20  8:18 PM  Result Value Ref Range   Lipase 18 11 - 51 U/L    Comment: Performed at [facility]  [address], [place], [place] [address]  Hepatic function panel     Status: Abnormal   Collection Time: 04/18/20  8:18 PM  Result Value Ref Range   Total Protein 8.3 (H) 6.5 - 8.1 g/dL   Albumin 2.6 (L) 3.5 - 5.0 g/dL   AST 21 15 - 41 U/L   ALT 13 0 - 44 U/L   Alkaline Phosphatase 82 38 - 126 U/L   Total Bilirubin 1.1 0.3 - 1.2 mg/dL   Bilirubin, Direct 0.3 (H) 0.0 - 0.2 mg/dL   Indirect Bilirubin 0.8 0.3 - 0.9 mg/dL    Comment: Performed at [facility] [address], [place], [place] [address]  Hemoglobin and hematocrit, blood     Status: Abnormal   Collection Time: 04/19/20  4:24 AM  Result Value Ref Range   Hemoglobin 7.8 (L) 12.0 - 15.0 g/dL   HCT 23.6 (L) 36 - 46 %    Comment: Performed at [facility] [address] [place], [place] [address]  CBC     Status: Abnormal   Collection Time: 04/19/20  9:32 AM  Result Value Ref Range   WBC 28.8 (H) 4.0 - 10.5 K/uL   RBC 3.59 (L) 3.87 - 5.11 MIL/uL   Hemoglobin 8.7 (L) 12.0 - 15.0 g/dL    Comment: Reticulocyte Hemoglobin testing may be clinically indicated, consider ordering this additional test [id number]    HCT 26.5 (L) 36 - 46 %   MCV 73.8 (L) 80.0 - 100.0 fL   MCH 24.2 (L) 26.0 - 34.0 pg   MCHC 32.8 30.0 - 36.0 g/dL   RDW 19.3 (H) 11.5 - 15.5 %   Platelets 475 (H) 150 - 400 K/uL   nRBC 0.0 0.0 - 0.2 %    Comment: Performed at [facility], [place] [address] [place], [place] [address]  Basic metabolic panel     Status: Abnormal   Collection Time: 04/19/20  9:32 AM  Result Value Ref Range   Sodium 136 135 - 145 mmol/L   Potassium 3.3 (L) 3.5 - 5.1 mmol/L   Chloride 103 98 - 111  mmol/L   CO2 21 (L) 22 - 32 mmol/L   Glucose, Bld 126 (H) 70 - 99 mg/dL    Comment: Glucose reference range applies only to samples taken after fasting for at least 8 hours.   BUN <5 (L) 6 - 20 mg/dL   Creatinine, Ser 0.93 0.44 - 1.00 mg/dL   Calcium 8.8 (L) 8.9 - 10.3 mg/dL   GFR calc non Af Amer >60 >60 mL/min   GFR calc Af Amer  >60 >60 mL/min   Anion gap 12 5 - 15    Comment: Performed at [facility] [address], [place], [place] [address]  Lactic acid, plasma     Status: None   Collection Time: 04/19/20 11:16 AM  Result Value Ref Range   Lactic Acid, Venous 1.1 0.5 - 1.9 mmol/L    Comment: Performed at [facility] [address] [place], [place] [address]    MICRO:  IMAGING: US Pelvis Complete  Result Date: 04/17/2020 CLINICAL DATA:  34 year old female with vaginal discharge. EXAM: TRANSABDOMINAL ULTRASOUND OF PELVIS TECHNIQUE: Transabdominal ultrasound examination of the pelvis was performed including evaluation of the uterus, ovaries, adnexal regions, and pelvic cul-de-sac. COMPARISON:  Pelvic ultrasound dated 02/11/2020. FINDINGS: Uterus Measurements: 15.7 x 10.8 x 14.5 cm = volume: 1273 mL. The uterus is enlarged, heterogeneous, and myomatous. The uterus is poorly visualized due to shadowing called by calcified fibroid. There is a 12.2 x 9.4 x 12.4 cm upper body or fundal intramural fibroid with probable submucosal component. A 7.9 x 6.3 x 8.6 cm partially exophytic posterior body fibroid is also noted. Several additional fibroids are likely present but poorly visualized. Endometrium The endometrium is not visualized. Right ovary Measurements: 3.0 x 1.7 x 2.6 cm = volume: 7.1 mL. Normal appearance/no adnexal mass. Left ovary The left ovary is not visualized. Other findings:  No abnormal free fluid. IMPRESSION: 1. Enlarged myomatous uterus. 2. Unremarkable right ovary. 3. Nonvisualization of the endometrium and the left ovary. Electronically Signed   By: Anner Crete M.D.   On: 04/17/2020 20:36   CT ABDOMEN PELVIS W CONTRAST  Result Date: 04/19/2020 CLINICAL DATA:  Abdominal pain and fever. EXAM: CT ABDOMEN AND PELVIS WITH CONTRAST TECHNIQUE: Multidetector CT imaging of the abdomen and pelvis was performed using the standard protocol following bolus administration of intravenous contrast.  CONTRAST:  123mL OMNIPAQUE IOHEXOL 300 MG/ML  SOLN COMPARISON:  Pelvic ultrasound-04/17/2020; pelvic MRI-12/23/2019 FINDINGS: Lower chest: Limited visualization of the lower thorax demonstrates minimal dependent subpleural ground-glass atelectasis. No discrete focal airspace opacities. Normal heart size.  No pericardial effusion. Hepatobiliary: Normal hepatic contour. No discrete hepatic lesions. Potential layering radiopaque debris within otherwise normal-appearing gallbladder as could be seen in the setting of gallbladder sludge (image 38, series 3). No definitive gallbladder wall thickening or pericholecystic fluid. No radiopaque gallstones. No intra extrahepatic bili duct dilatation. No ascites. Pancreas: Normal appearance of the pancreas. Spleen: Normal appearance of the spleen. Adrenals/Urinary Tract: Moderate bilateral grossly symmetric ureterectasis and pelvicaliectasis secondary to mass effect of the markedly enlarged myomatous uterus on the bilateral ureters, new compared to abdominal/pelvic MRI performed 01/22/2020. No renal stones this postcontrast examination. No discrete renal lesions. No perinephric stranding. Normal appearance of the bilateral adrenal glands. There is mass effect of the fibroid uterus upon an otherwise normal-appearing urinary bladder. Stomach/Bowel: There is mass effect on the sigmoid colon by the markedly enlarged myomatous uterus. Ingested enteric contrast extends to the level of the ascending colon. No evidence of  enteric obstruction. No discrete areas of bowel wall thickening. Normal appearance of the terminal ileum. The appendix is not visualized, however there is no pericecal inflammatory change. No pneumoperitoneum, pneumatosis or portal venous gas. Vascular/Lymphatic: Normal caliber the abdominal aorta. The major branch vessels of the abdominal aorta appear patent on this non CTA examination. No bulky retroperitoneal, mesenteric, pelvic or inguinal lymphadenopathy.  Reproductive: The uterus is markedly enlarged measuring approximately 22.2 x 16.3 x 9.4 cm (sagittal image 60, series 7; axial image 54, series 3) as it is replaced with multiple hypertrophied hypoenhancing uterine fibroids. There is a dominant fibroid within the lower uterine segment which measures approximately 10.2 x 10.0 x 9.5 cm (sagittal image 68, series 7; axial image 65, series 3) and contains several foci of intraluminal air as well as mixed attenuating debris, which given provided history of fever, superimposed infection is not excluded. Expected hypertrophy of the bilateral gonadal veins. No discrete adnexal lesions. Other: Minimal subcutaneous edema about the midline of the low back. Musculoskeletal: No acute or aggressive osseous abnormalities. IMPRESSION: 1. Markedly enlarged fibroid uterus measuring 22.2 cm in maximal diameter. 2. A dominant fibroid within the lower uterine segment measuring 10.2 cm contains multiple foci of air, nonspecific though given provided history of fever, superimposed infection is not excluded. 3. Moderate bilateral ureterectasis and pelvicaliectasis secondary to mass effect of the myomatous uterus on the bilateral ureters, new compared to abdominal/pelvic MRI performed 12/24/2019. 4. Potential gallbladder sludge within an otherwise normal-appearing gallbladder. Critical Value/emergent results were called by telephone at the time of interpretation on 04/19/2020 at 1:41 pm to provider [facility] , who verbally acknowledged these results. Electronically Signed   By: Sandi Mariscal M.D.   On: 04/19/2020 13:45    HISTORICAL MICRO/IMAGING  Assessment/Plan:  Consider changing to ceftriaxone, metronidazole  for more gynecology/GI related infection. No other sources found for now. Don't necessarily think this is needs pseudomonal coverage. +/- MRSA coverage.   Does not seem to have classical symptoms consistent with cdifficile  Defer to dr Cecille Po - for timing of  surgery. From id standpoint would support having it on Tuesday  Leukocytosis = likely leukamoid like reaction. Will check cbc with diff.

## 2020-04-19 NOTE — Plan of Care (Signed)
  Problem: Clinical Measurements: Goal: Will remain free from infection Outcome: Progressing   Problem: Nutrition: Goal: Adequate nutrition will be maintained Outcome: Progressing   Problem: Coping: Goal: Level of anxiety will decrease Outcome: Progressing

## 2020-04-19 NOTE — Consult Note (Signed)
GYNECOLOGY ATTENDING CONSULT NOTE  Consult Date: 04/19/2020  Reason for Consult: Fever of unknown origin, concerned about PID, fibroids, anemia Consulting Provider: Dr. Niel Hummer  Assessment/Plan: Unsure of etiology of fever and leukocytosis, patient has chronic pelvic pain due to fibroids, vaginal discharge analysis is unremarkable at this point and GC/Chlam cultures are pending. She is on Vancomycin and Zosyn which is more than adequate for PID treatment, although there is no evidence she has this for now. She is s/p transfusion for her anemia, further transfusions can be given as needed. She is not bleeding currently, no intervention needed.  No further intervention needed at this point unless any further concerning findings seen on CT. Patient is scheduled for myomectomy on 04/21/2020, this may be affected given the patient's current admission, will inform Dr. Ihor Dow.  Appreciate care of [facility] by her primary team.  We will follow along. Please call [phone] [facility] OB/GYN Consult Attending Monday-Friday 8am - 5pm) or [phone] [facility] OB/GYN Attending On Call all day, every day) for any gynecologic concerns at any time.  Thank you for involving [place] in the care of this patient.  Total consultation time including face-to-face time with patient (>50% of time), reviewing chart and documentation: 30 minutes    Sandra Schneiders, MD, [place], [facility] for [place], [place] Phone: [phone]    History of Present Illness: Sandra Gregory is an 34 y.o. G55P1011 female who was admitted on 04/17/2020 for fever of unknown etiology of 102.4, while being evaluated for preoperative testing for scheduled myomectomy with Dr. Lavonia Drafts scheduled on 04/21/2020.  She endorses feeling presyncopal due to long history of abnormal uterine bleeding and fibroids, but no other localizing symptoms. Admission labs  showed a WBC 21, hemoglobin of 6.7, COVID test was negative.  She was tachycardic to 140, sepsis protocol was initiated in the ER and she was subsequently admitted. She was started on Zosyn, and transfused with two pRBCs.  Chest X ray was negative, pelvic ultrasound showed fibroids. Wet prep unremarkable, GC/Chlam pending. Patient did report chronic abdominal pain due to fibroids, and given no clear etiology of fevers, the primary wondered it this was PID. WBC has continued to increase to 28.8 today, and fever curve is decreasing. Current hemoglobin after transfusion is 8.7.  But given her symptoms and WBC, her antibiotic coverage was broadened to include Vancomycin today, and we were consulted.    On encounter with patient, patient reports she feels overall better and reports minimal pain. No bleeding. She is concerned about her WBC count elevation and not knowing what is causing it.   Pertinent OB/GYN History: No LMP recorded.  Recent SAB in 02/3334, no complications One term SVD in 4562, no complications Long history of menometrorrhagia and anemia and fibroids. Distant history of abnormal pap smear   Patient Active Problem List   Diagnosis Date Noted  . Fever 04/17/2020  . Tachycardia 04/17/2020  . Iron deficiency anemia due to chronic blood loss 04/17/2020  . Symptomatic anemia 04/17/2020  . Fibroids 04/13/2020  . Sickle cell trait ([place]) 01/24/2020    Past Medical History:  Diagnosis Date  . Anemia   . Anxiety   . Depression   . 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 NO PAST SURGERIES      Family History  Problem Relation Age of Onset  . Cancer Maternal Grandmother  breast  . Diabetes Maternal Grandmother   . Hypertension Maternal Grandmother   . Diabetes Maternal Grandfather   . Hypertension Maternal Grandfather     Social History:  reports that she quit smoking about 4 months ago. Her smoking use included  cigarettes. She has never used smokeless tobacco. She reports current alcohol use of about 1.0 standard drink of alcohol per week. She reports previous drug use. Drug: Marijuana.  Allergies: No Known Allergies  Medications:  I have reviewed the patient's current medications. Prior to Admission:  Medications Prior to Admission  Medication Sig Dispense Refill Last Dose  . acetaminophen (TYLENOL) 500 MG tablet Take 500 mg by mouth every 6 (six) hours as needed for mild pain.   04/17/2020 at Unknown time  . iron polysaccharides (NIFEREX) 150 MG capsule Take 1 capsule (150 mg total) by mouth daily. 30 capsule 3 04/16/2020 at Unknown time  . traMADol (ULTRAM) 50 MG tablet Take 1 tablet (50 mg total) by mouth every 6 (six) hours as needed for severe pain. 20 tablet 2 04/17/2020 at Unknown time   Scheduled: . sodium chloride   Intravenous Once  . sodium chloride   Intravenous Once  . multivitamin with minerals  1 tablet Oral Daily   Continuous: . sodium chloride 125 mL/hr at 04/19/20 0812  . lactated ringers Stopped (04/19/20 0832)  . piperacillin-tazobactam 3.375 g (04/19/20 0813)  . sodium chloride    . sodium chloride     HBZ:[id number] **OR** acetaminophen, ondansetron **OR** ondansetron (ZOFRAN) IV, polyethylene glycol Anti-infectives (From admission, onward)   Start     Dose/Rate Route Frequency Ordered Stop   04/18/20 2000  cefTRIAXone (ROCEPHIN) 2 g in sodium chloride 0.9 % 100 mL IVPB  Status:  Discontinued        2 g 200 mL/hr over 30 Minutes Intravenous Every 24 hours 04/17/20 2359 04/18/20 0136   04/18/20 1100  fluconazole (DIFLUCAN) tablet 200 mg  Status:  Discontinued        200 mg Oral Daily 04/18/20 1016 04/19/20 1105   04/18/20 0200  piperacillin-tazobactam (ZOSYN) IVPB 3.375 g     Discontinue     3.375 g 12.5 mL/hr over 240 Minutes Intravenous Every 8 hours 04/18/20 0131     04/17/20 1745  cefTRIAXone (ROCEPHIN) 2 g in sodium chloride 0.9 % 100 mL IVPB        2 g 200  mL/hr over 30 Minutes Intravenous  Once 04/17/20 1735 04/17/20 2044      Review of Systems: Pertinent items noted in HPI and remainder of comprehensive ROS otherwise negative.  Focused Physical Examination BP 96/80 (BP Location: Right Arm)   Pulse (!) 108   Temp 100.1 F (37.8 C)   Resp 18   Ht 5\' 7"  (1.702 m)   Wt 81.8 kg   SpO2 98%   BMI 28.24 kg/m  CONSTITUTIONAL: Well-developed, well-nourished female in no acute distress.  SKIN: Skin is warm and dry. No rash noted. Not diaphoretic. No erythema. No pallor. [place]: Alert and oriented to person, place, and time. Normal reflexes, muscle tone coordination. No cranial nerve deficit noted. PSYCHIATRIC: Normal mood and affect. Normal behavior. Normal judgment and thought content. CARDIOVASCULAR: Normal heart rate noted, regular rhythm RESPIRATORY: Effort and breath sounds normal, no problems with respiration noted. ABDOMEN: Soft,enlarged fibroid uterus palpated.  Mild tenderness to palpation, no rebound or guarding.  PELVIC: Deferred. MUSCULOSKELETAL: Normal range of motion. No tenderness.  No cyanosis, clubbing, or edema.  2+ distal pulses.  Labs  and Imaging: Results for orders placed or performed during the hospital encounter of 04/17/20 (from the past 72 hour(s))  Lactic acid, plasma     Status: None   Collection Time: 04/17/20  4:18 PM  Result Value Ref Range   Lactic Acid, Venous 1.4 0.5 - 1.9 mmol/L    Comment: Performed at [facility], [address], [place], [place] [address]  Comprehensive metabolic panel     Status: Abnormal   Collection Time: 04/17/20  4:45 PM  Result Value Ref Range   Sodium 130 (L) 135 - 145 mmol/L   Potassium 3.1 (L) 3.5 - 5.1 mmol/L   Chloride 95 (L) 98 - 111 mmol/L   CO2 21 (L) 22 - 32 mmol/L   Glucose, Bld 97 70 - 99 mg/dL    Comment: Glucose reference range applies only to samples taken after fasting for at least 8 hours.   BUN 5 (L) 6 - 20 mg/dL   Creatinine, Ser 0.94 0.44 - 1.00  mg/dL   Calcium 8.9 8.9 - 10.3 mg/dL   Total Protein 8.0 6.5 - 8.1 g/dL   Albumin 2.7 (L) 3.5 - 5.0 g/dL   AST 20 15 - 41 U/L   ALT 15 0 - 44 U/L   Alkaline Phosphatase 77 38 - 126 U/L   Total Bilirubin 0.8 0.3 - 1.2 mg/dL   GFR calc non Af Amer >60 >60 mL/min   GFR calc Af Amer >60 >60 mL/min   Anion gap 14 5 - 15    Comment: Performed at [facility], [place] [address], [place], [place] [address]  CBC WITH DIFFERENTIAL     Status: Abnormal   Collection Time: 04/17/20  4:45 PM  Result Value Ref Range   WBC 21.8 (H) 4.0 - 10.5 K/uL   RBC 2.93 (L) 3.87 - 5.11 MIL/uL   Hemoglobin 6.7 (LL) 12.0 - 15.0 g/dL    Comment: REPEATED TO VERIFY Reticulocyte Hemoglobin testing may be clinically indicated, consider ordering this additional test [id number] THIS CRITICAL RESULT HAS VERIFIED AND BEEN CALLED TO RN R HARDY BY LESLIE BENFIELD ON 07 30 2021 AT 1710, AND HAS BEEN READ BACK.     HCT 22.0 (L) 36 - 46 %   MCV 75.1 (L) 80.0 - 100.0 fL   MCH 22.9 (L) 26.0 - 34.0 pg   MCHC 30.5 30.0 - 36.0 g/dL   RDW 20.8 (H) 11.5 - 15.5 %   Platelets 499 (H) 150 - 400 K/uL   nRBC 0.0 0.0 - 0.2 %   Neutrophils Relative % 87 %   Neutro Abs 18.9 (H) 1.7 - 7.7 K/uL   Lymphocytes Relative 8 %   Lymphs Abs 1.7 0.7 - 4.0 K/uL   Monocytes Relative 4 %   Monocytes Absolute 0.9 0 - 1 K/uL   Eosinophils Relative 0 %   Eosinophils Absolute 0.0 0 - 0 K/uL   Basophils Relative 0 %   Basophils Absolute 0.1 0 - 0 K/uL   Immature Granulocytes 1 %   Abs Immature Granulocytes 0.20 (H) 0.00 - 0.07 K/uL    Comment: Performed at [facility] [address] [place], [place] [address]  Protime-INR     Status: None   Collection Time: 04/17/20  4:45 PM  Result Value Ref Range   Prothrombin Time 15.0 11.4 - 15.2 seconds   INR 1.2 0.8 - 1.2    Comment: (NOTE) INR goal varies based on device and disease states.  Performed at [facility], [place] [address], [place], [place] [address]   APTT     Status:  None   Collection Time: 04/17/20  4:45 PM  Result Value Ref Range   aPTT 32 24 - 36 seconds    Comment: Performed at [facility] [address] [place], [place] [address]  I-Stat beta hCG blood, ED     Status: None   Collection Time: 04/17/20  4:59 PM  Result Value Ref Range   I-stat hCG, quantitative <5.0 <5 mIU/mL   Comment 3            Comment:   GEST. AGE      CONC.  (mIU/mL)   <=1 WEEK        5 - 50     2 WEEKS       50 - 500     3 WEEKS       100 - 10,000     4 WEEKS     1,000 - 30,000        FEMALE AND NON-PREGNANT FEMALE:     LESS THAN 5 mIU/mL   Blood culture (routine single)     Status: None (Preliminary result)   Collection Time: 04/17/20  5:00 PM   Specimen: BLOOD  Result Value Ref Range   Specimen Description BLOOD SITE NOT SPECIFIED    Special Requests      BOTTLES DRAWN AEROBIC AND ANAEROBIC Blood Culture adequate volume   Culture      NO GROWTH 2 DAYS Performed at [facility], [place] [address], [place], [place] [address]    Report Status PENDING   Urinalysis, Routine w reflex microscopic     Status: Abnormal   Collection Time: 04/17/20  6:58 PM  Result Value Ref Range   Color, Urine YELLOW YELLOW   APPearance CLEAR CLEAR   Specific Gravity, Urine 1.009 1.005 - 1.030   pH 6.0 5.0 - 8.0   Glucose, UA NEGATIVE NEGATIVE mg/dL   Hgb urine dipstick MODERATE (A) NEGATIVE   Bilirubin Urine NEGATIVE NEGATIVE   Ketones, ur NEGATIVE NEGATIVE mg/dL   Protein, ur NEGATIVE NEGATIVE mg/dL   Nitrite NEGATIVE NEGATIVE   Leukocytes,Ua MODERATE (A) NEGATIVE   RBC / HPF 6-10 0 - 5 RBC/hpf   WBC, UA 6-10 0 - 5 WBC/hpf   Bacteria, UA RARE (A) NONE SEEN   Squamous Epithelial / LPF 0-5 0 - 5    Comment: Performed at [facility], [place] [address], [place], [place] [address]  Urine culture     Status: Abnormal   Collection Time: 04/17/20  6:58 PM   Specimen: In/Out Cath Urine  Result Value Ref Range   Specimen Description IN/OUT CATH URINE    Special  Requests      NONE Performed at [facility], [place] [address], [place], [place] [address]    Culture MULTIPLE SPECIES PRESENT, SUGGEST RECOLLECTION (A)    Report Status 04/19/2020 FINAL   Wet prep, genital     Status: Abnormal   Collection Time: 04/17/20  7:13 PM  Result Value Ref Range   Yeast Wet Prep HPF POC NONE SEEN NONE SEEN   Trich, Wet Prep NONE SEEN NONE SEEN   Clue Cells Wet Prep HPF POC NONE SEEN NONE SEEN   WBC, Wet Prep HPF POC FEW (A) NONE SEEN   Sperm NONE SEEN     Comment: Performed at [facility],  [address] [place], [place] [address]  SARS Coronavirus 2 by RT PCR (hospital order, performed in [facility] hospital lab) Nasopharyngeal Nasopharyngeal Swab     Status: None   Collection Time: 04/17/20  7:55 PM   Specimen: Nasopharyngeal Swab  Result Value Ref Range   SARS Coronavirus 2 NEGATIVE NEGATIVE    Comment: (NOTE) SARS-CoV-2 target nucleic acids are NOT DETECTED.  The SARS-CoV-2 RNA is generally detectable in upper and lower respiratory specimens during the acute phase of infection. The lowest concentration of SARS-CoV-2 viral copies this assay can detect is 250 copies / mL. A negative result does not preclude SARS-CoV-2 infection and should not be used as the sole basis for treatment or other patient management decisions.  A negative result may occur with improper specimen collection / handling, submission of specimen other than nasopharyngeal swab, presence of viral mutation(s) within the areas targeted by this assay, and inadequate number of viral copies (<250 copies / mL). A negative result must be combined with clinical observations, patient history, and epidemiological information.  Fact Sheet for Patients:   StrictlyIdeas.no  Fact Sheet for Healthcare Providers: BankingDealers.co.za  This test is not yet approved or  cleared by the [place] FDA and has been authorized for  detection and/or diagnosis of SARS-CoV-2 by FDA under an Emergency Use Authorization (EUA).  This EUA will remain in effect (meaning this test can be used) for the duration of the COVID-19 declaration under Section 564(b)(1) of the Act, 21 U.S.C. section 360bbb-3(b)(1), unless the authorization is terminated or revoked sooner.  Performed at [facility], [place] [address], [place], [place] [address]   Vitamin B12     Status: None   Collection Time: 04/17/20  7:55 PM  Result Value Ref Range   Vitamin B-12 618 180 - 914 pg/mL    Comment: (NOTE) This assay is not validated for testing neonatal or myeloproliferative syndrome specimens for Vitamin B12 levels. Performed at [facility], [place] [address], [place], [place] [address]   Iron and TIBC     Status: Abnormal   Collection Time: 04/17/20  7:55 PM  Result Value Ref Range   Iron 26 (L) 28 - 170 ug/dL   TIBC 195 (L) 250 - 450 ug/dL   Saturation Ratios 13 10.4 - 31.8 %   UIBC 169 ug/dL    Comment: Performed at [facility], [place] [address] [place], [place] [address]  Ferritin     Status: None   Collection Time: 04/17/20  7:55 PM  Result Value Ref Range   Ferritin 170 11 - 307 ng/mL    Comment: Performed at [facility], [place] [address] [place], [place] [address]  Folate     Status: None   Collection Time: 04/17/20  7:55 PM  Result Value Ref Range   Folate 10.5 >5.9 ng/mL    Comment: Performed at [facility], [place] [address] [place], [place] [address]  Lactic acid, plasma     Status: None   Collection Time: 04/17/20  7:58 PM  Result Value Ref Range   Lactic Acid, Venous 1.1 0.5 - 1.9 mmol/L    Comment: Performed at [facility] [address], [place], [place] [address]  Reticulocytes     Status: Abnormal   Collection Time: 04/17/20  7:58 PM  Result Value Ref Range   Retic Ct Pct 1.3 0.4 - 3.1 %   RBC. 2.90 (L) 3.87 - 5.11  MIL/uL   Retic Count, Absolute 37.1 19.0 - 186.0 K/uL   Immature  Retic Fract 34.0 (H) 2.3 - 15.9 %    Comment: Performed at [facility] [address] [place], [place] [address]  Prepare RBC (crossmatch)     Status: None   Collection Time: 04/18/20  1:06 AM  Result Value Ref Range   Order Confirmation      ORDER PROCESSED BY BLOOD BANK Performed at [facility], [place] [address] [place], [place] [address]   Type and screen [place]     Status: None (Preliminary result)   Collection Time: 04/18/20  1:06 AM  Result Value Ref Range   ABO/RH(D) A POS    Antibody Screen NEG    Sample Expiration 04/21/2020,2359    Unit Number [id number]    Blood Component Type RED CELLS,LR    Unit division 00    Status of Unit ISSUED    Transfusion Status OK TO TRANSFUSE    Crossmatch Result      Compatible Performed at [facility], [place] [address], [place], [place] [address]    Unit Number [id number]    Blood Component Type RED CELLS,LR    Unit division 00    Status of Unit ISSUED    Transfusion Status OK TO TRANSFUSE    Crossmatch Result Compatible   Prepare RBC (crossmatch)     Status: None   Collection Time: 04/18/20  1:31 AM  Result Value Ref Range   Order Confirmation      ORDER PROCESSED BY BLOOD BANK Performed at [facility], [place] [address], [place], [place] [address]   Culture, blood (single)     Status: None (Preliminary result)   Collection Time: 04/18/20  2:38 AM   Specimen: BLOOD  Result Value Ref Range   Specimen Description BLOOD RIGHT FOREARM    Special Requests      BOTTLES DRAWN AEROBIC AND ANAEROBIC Blood Culture adequate volume   Culture      NO GROWTH 1 DAY Performed at [facility], [place] [address] [place], [place] [address]    Report Status PENDING   Prepare RBC (crossmatch)     Status: None   Collection Time: 04/18/20  7:58 AM  Result Value Ref Range   Order Confirmation      ORDER PROCESSED BY BLOOD BANK Performed at [facility], [place] [address],  [place], [place] [address]   Basic metabolic panel     Status: Abnormal   Collection Time: 04/18/20  8:57 AM  Result Value Ref Range   Sodium 136 135 - 145 mmol/L   Potassium 3.3 (L) 3.5 - 5.1 mmol/L   Chloride 104 98 - 111 mmol/L   CO2 24 22 - 32 mmol/L   Glucose, Bld 109 (H) 70 - 99 mg/dL    Comment: Glucose reference range applies only to samples taken after fasting for at least 8 hours.   BUN <5 (L) 6 - 20 mg/dL   Creatinine, Ser 0.78 0.44 - 1.00 mg/dL   Calcium 8.7 (L) 8.9 - 10.3 mg/dL   GFR calc non Af Amer >60 >60 mL/min   GFR calc Af Amer >60 >60 mL/min   Anion gap 8 5 - 15    Comment: Performed at [facility] [address] [place], [place] [address]  Magnesium     Status: None   Collection Time: 04/18/20  8:57 AM  Result Value Ref Range   Magnesium 1.8 1.7 - 2.4 mg/dL    Comment: Performed at [facility] [address], [place], [place] [address]  CBC     Status: Abnormal   Collection Time: 04/18/20  8:57 AM  Result Value Ref Range   WBC 18.7 (H) 4.0 - 10.5 K/uL   RBC 3.13 (L) 3.87 - 5.11 MIL/uL   Hemoglobin 7.3 (L) 12.0 - 15.0 g/dL    Comment: Reticulocyte Hemoglobin testing may be clinically indicated, consider ordering this additional test [id number]    HCT 22.8 (L) 36 - 46 %   MCV 72.8 (L) 80.0 - 100.0 fL   MCH 23.3 (L) 26.0 - 34.0 pg   MCHC 32.0 30.0 - 36.0 g/dL   RDW 19.1 (H) 11.5 - 15.5 %   Platelets 468 (H) 150 - 400 K/uL   nRBC 0.0 0.0 - 0.2 %    Comment: Performed at [facility], [place] [address], [place], [place] [address]  Hemoglobin and hematocrit, blood     Status: Abnormal   Collection Time: 04/18/20  8:18 PM  Result Value Ref Range   Hemoglobin 9.7 (L) 12.0 - 15.0 g/dL    Comment: REPEATED TO VERIFY POST TRANSFUSION SPECIMEN    HCT 29.7 (L) 36 - 46 %    Comment: Performed at [facility] [address] [place], [place] [address]  Lipase, blood     Status: None   Collection Time: 04/18/20  8:18 PM  Result Value Ref  Range   Lipase 18 11 - 51 U/L    Comment: Performed at [facility] [address] [place], [place] [address]  Hepatic function panel     Status: Abnormal   Collection Time: 04/18/20  8:18 PM  Result Value Ref Range   Total Protein 8.3 (H) 6.5 - 8.1 g/dL   Albumin 2.6 (L) 3.5 - 5.0 g/dL   AST 21 15 - 41 U/L   ALT 13 0 - 44 U/L   Alkaline Phosphatase 82 38 - 126 U/L   Total Bilirubin 1.1 0.3 - 1.2 mg/dL   Bilirubin, Direct 0.3 (H) 0.0 - 0.2 mg/dL   Indirect Bilirubin 0.8 0.3 - 0.9 mg/dL    Comment: Performed at [facility] [address], [place], [place] [address]  Hemoglobin and hematocrit, blood     Status: Abnormal   Collection Time: 04/19/20  4:24 AM  Result Value Ref Range   Hemoglobin 7.8 (L) 12.0 - 15.0 g/dL   HCT 23.6 (L) 36 - 46 %    Comment: Performed at [facility] [address] [place], [place] [address]  CBC     Status: Abnormal   Collection Time: 04/19/20  9:32 AM  Result Value Ref Range   WBC 28.8 (H) 4.0 - 10.5 K/uL   RBC 3.59 (L) 3.87 - 5.11 MIL/uL   Hemoglobin 8.7 (L) 12.0 - 15.0 g/dL    Comment: Reticulocyte Hemoglobin testing may be clinically indicated, consider ordering this additional test [id number]    HCT 26.5 (L) 36 - 46 %   MCV 73.8 (L) 80.0 - 100.0 fL   MCH 24.2 (L) 26.0 - 34.0 pg   MCHC 32.8 30.0 - 36.0 g/dL   RDW 19.3 (H) 11.5 - 15.5 %   Platelets 475 (H) 150 - 400 K/uL   nRBC 0.0 0.0 - 0.2 %    Comment: Performed at [facility], [address] [place], [place] [address]  Basic metabolic panel     Status: Abnormal   Collection Time: 04/19/20  9:32 AM  Result Value Ref Range   Sodium 136 135 - 145 mmol/L   Potassium 3.3 (L) 3.5 - 5.1 mmol/L   Chloride 103 98 - 111 mmol/L   CO2 21 (L) 22 - 32 mmol/L   Glucose, Bld 126 (H) 70 - 99 mg/dL    Comment: Glucose reference range applies only to samples taken after fasting for at least 8 hours.   BUN <5 (L) 6 - 20 mg/dL   Creatinine, Ser 0.93 0.44 - 1.00 mg/dL    Calcium 8.8 (L) 8.9 - 10.3 mg/dL   GFR calc non Af Amer >60 >60 mL/min   GFR calc Af Amer >60 >60 mL/min   Anion gap 12 5 - 15    Comment: Performed at [facility] [address] [place], [place] [address]     DG Chest 2 View  Result Date: 04/17/2020 CLINICAL DATA:  Shortness of breath.  Dizziness. EXAM: CHEST - 2 VIEW COMPARISON:  None. FINDINGS: The cardiomediastinal contours are normal. The lungs are clear. Pulmonary vasculature is normal. No consolidation, pleural effusion, or pneumothorax. No acute osseous abnormalities are seen. IMPRESSION: Negative radiographs of the chest. Electronically Signed   By: Keith Rake M.D.   On: 04/17/2020 14:52   US Pelvis Complete  Result Date: 04/17/2020 CLINICAL DATA:  34 year old female with vaginal discharge. EXAM: TRANSABDOMINAL ULTRASOUND OF PELVIS TECHNIQUE: Transabdominal ultrasound examination of the pelvis was performed including evaluation of the uterus, ovaries, adnexal regions, and pelvic cul-de-sac. COMPARISON:  Pelvic ultrasound dated 02/11/2020. FINDINGS: Uterus Measurements: 15.7 x 10.8 x 14.5 cm = volume: 1273 mL. The uterus is enlarged, heterogeneous, and myomatous. The uterus is poorly visualized due to shadowing called by calcified fibroid. There is a 12.2 x 9.4 x 12.4 cm upper body or fundal intramural fibroid with probable submucosal component. A 7.9 x 6.3 x 8.6 cm partially exophytic posterior body fibroid is also noted. Several additional fibroids are likely present but poorly visualized. Endometrium The endometrium is not visualized. Right ovary Measurements: 3.0 x 1.7 x 2.6 cm = volume: 7.1 mL. Normal appearance/no adnexal mass. Left ovary The left ovary is not visualized. Other findings:  No abnormal free fluid. IMPRESSION: 1. Enlarged myomatous uterus. 2. Unremarkable right ovary. 3. Nonvisualization of the endometrium and the left ovary. Electronically Signed   By: Anner Crete M.D.   On: 04/17/2020 20:36

## 2020-04-19 NOTE — Progress Notes (Addendum)
Imaging Addendum CT ABDOMEN PELVIS W CONTRAST  Result Date: 04/19/2020 CLINICAL DATA:  Abdominal pain and fever. EXAM: CT ABDOMEN AND PELVIS WITH CONTRAST TECHNIQUE: Multidetector CT imaging of the abdomen and pelvis was performed using the standard protocol following bolus administration of intravenous contrast. CONTRAST:  129mL OMNIPAQUE IOHEXOL 300 MG/ML  SOLN COMPARISON:  Pelvic ultrasound-04/17/2020; pelvic MRI-12/23/2019 FINDINGS: Lower chest: Limited visualization of the lower thorax demonstrates minimal dependent subpleural ground-glass atelectasis. No discrete focal airspace opacities. Normal heart size.  No pericardial effusion. Hepatobiliary: Normal hepatic contour. No discrete hepatic lesions. Potential layering radiopaque debris within otherwise normal-appearing gallbladder as could be seen in the setting of gallbladder sludge (image 38, series 3). No definitive gallbladder wall thickening or pericholecystic fluid. No radiopaque gallstones. No intra extrahepatic bili duct dilatation. No ascites. Pancreas: Normal appearance of the pancreas. Spleen: Normal appearance of the spleen. Adrenals/Urinary Tract: Moderate bilateral grossly symmetric ureterectasis and pelvicaliectasis secondary to mass effect of the markedly enlarged myomatous uterus on the bilateral ureters, new compared to abdominal/pelvic MRI performed 01/22/2020. No renal stones this postcontrast examination. No discrete renal lesions. No perinephric stranding. Normal appearance of the bilateral adrenal glands. There is mass effect of the fibroid uterus upon an otherwise normal-appearing urinary bladder. Stomach/Bowel: There is mass effect on the sigmoid colon by the markedly enlarged myomatous uterus. Ingested enteric contrast extends to the level of the ascending colon. No evidence of enteric obstruction. No discrete areas of bowel wall thickening. Normal appearance of the terminal ileum. The appendix is not visualized, however there  is no pericecal inflammatory change. No pneumoperitoneum, pneumatosis or portal venous gas. Vascular/Lymphatic: Normal caliber the abdominal aorta. The major branch vessels of the abdominal aorta appear patent on this non CTA examination. No bulky retroperitoneal, mesenteric, pelvic or inguinal lymphadenopathy. Reproductive: The uterus is markedly enlarged measuring approximately 22.2 x 16.3 x 9.4 cm (sagittal image 60, series 7; axial image 54, series 3) as it is replaced with multiple hypertrophied hypoenhancing uterine fibroids. There is a dominant fibroid within the lower uterine segment which measures approximately 10.2 x 10.0 x 9.5 cm (sagittal image 68, series 7; axial image 65, series 3) and contains several foci of intraluminal air as well as mixed attenuating debris, which given provided history of fever, superimposed infection is not excluded. Expected hypertrophy of the bilateral gonadal veins. No discrete adnexal lesions. Other: Minimal subcutaneous edema about the midline of the low back. Musculoskeletal: No acute or aggressive osseous abnormalities. IMPRESSION: 1. Markedly enlarged fibroid uterus measuring 22.2 cm in maximal diameter. 2. A dominant fibroid within the lower uterine segment measuring 10.2 cm contains multiple foci of air, nonspecific though given provided history of fever, superimposed infection is not excluded. 3. Moderate bilateral ureterectasis and pelvicaliectasis secondary to mass effect of the myomatous uterus on the bilateral ureters, new compared to abdominal/pelvic MRI performed 12/24/2019. 4. Potential gallbladder sludge within an otherwise normal-appearing gallbladder. Critical Value/emergent results were called by telephone at the time of interpretation on 04/19/2020 at 1:41 pm to provider [facility] , who verbally acknowledged these results. Electronically Signed   By: Sandi Mariscal M.D.   On: 04/19/2020 13:45    It is unlikely to have fibroid degeneration/central  necrosis cause these high fevers and such elevated WBC, especially given that patient does not have any significant pain on examination or peritoneal signs.  There are isolated rare cases in literature of pyomyoma (infected fibroids with foci of gas and possible pus) that usually occur after fibroid embolization,  but can also occur after recent pregnancy (she had a miscarriage in May). The treatment is still broad spectrum antibiotics and surgical treatment after antibiotic therapy.  Even with this possible rarity, she has adequate antibiotic coverage and no emergent surgical intervention is needed today.  Not acutely concerned about the fibroid uterus' mass effect on the ureters, especially given patient's normal Cr and UOP.  Will continue antibiotic therapy as ordered, we are reassured that the patient overall feels better and her fever curve is lessened (T100 now, was 102.4 on admission), she is not tachycardic, has normal lactic acid.  Only concerning feature is her WBC of 28.8 today.  We will continue to follow along, Dr. Ihor Dow will come to see and talk to patient tomorrow 04/20/20.   Thank you for continuing to care for our mutual patient.    Verita Schneiders, MD, [place] for [employer], [place]

## 2020-04-19 NOTE — Progress Notes (Addendum)
PROGRESS NOTE    Sandra Gregory  ZOX:[id number] DOB: 05/31/1986 DOA: 04/17/2020 PCP: Default, Provider, MD   Brief Narrative: 34 year old with past medical history of miscarriage may  25, since then she cannot stand for long time because of dizziness.  She report lightheadedness.  She has been feeling weak, she report weight loss.  She report shortness of breath with activity.  She presented today for preop testing for myomectomy.  Patient was found to be tachycardic heart rate up to 140, temperature 102.  She was sent to the ER.  She met sepsis criteria.  White blood cell count of 21 and hemoglobin of 6.7.  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  She has several large fibroids with up to 12 cm long.    Assessment & Plan:   Principal Problem:   Fever Active Problems:   Fibroids   Tachycardia   Iron deficiency anemia due to chronic blood loss   Symptomatic anemia  1-Symptomatic anemia due to iron deficiency likely from menorrhagia: Received 2 units of packed red blood cells 7/31. Iron 26, ferritin 170 She will need IV iron when source of infection is controlled.  Bilirubin normal.  Hb stable 8.7 Start oral ferrous sulfate.   2-Sepsis: Patient presented with fever tachycardia leukocytosis. Covid 19 Negative.  Chest X-ray: Negative UA; moderate leukocytes, white blood cells 6-10. Pelvis ultrasound: Enlarged myomatous uterus, unremarkable right ovary, nonvisualization of the endometrium and left ovary. Urine culture: multiple morphology, resend urine culture.  Blood culture; pending Suspect related to UTI ?  WBC increase to 28./  Will check CT abdomen pelvis.  She report 2 episodes of diarrhea, after she took ensure. Monitor for further diarrhea.  Will add vancomycin. Check lactic acid. IV bolus. IV fluids.  GYN consulted.  Addendum;  Received called from radiologist about CT finding. Discussed with GYN on called Dr Verita Schneiders, fibroid can become necrotic, less likely  cause of infection. She will discussed further with Dr Maylene Roes  I will consult ID as well. Discussed with DR Baxter Flattery, plan to check for C diff.   3-Hypokalemia: replete orally.   4-Fibroid menorrhagia: Per OB Abdominal pain thought to be related to fibroid.  Lipase normal.   5-Tachycardia; form Dehydration, anemia, fever.  IV fluids, tylenol.  6-Hypokalemia; replete orally.   Estimated body mass index is 28.24 kg/m as calculated from the following:   Height as of this encounter: _0  (1.702 m).   Weight as of this encounter: 81.8 kg.   DVT prophylaxis: SCD Code Status: SCDs Family Communication: Wife at bedside Disposition Plan:  Status is: Inpatient  Dispo: The patient is from: Home              Anticipated d/c is to: Home              Anticipated d/c date is: 2 days              Patient currently is not medically stable to d/c.        Consultants:   None  Procedures:   None  Antimicrobials:    Subjective: She is feeling less dizzy, although still gets dizzy when she close her eyes.  She had 2 watery BM today, one of the BM was mix stool. Yesterday she had small BM. She has not received laxative.  She denies cough, she is making more urine.  She report Low back pain.  She report her chronic abdominal pain, although cramps are better.   Objective: Vitals:  04/19/20 0032 04/19/20 0433 04/19/20 0434 04/19/20 0951  BP: 100/79  (!) 96/59 96/80  Pulse: 103  (!) 106 (!) 108  Resp: 18   18  Temp: 99.8 F (37.7 C) 99.9 F (37.7 C)  100.1 F (37.8 C)  TempSrc: Oral Oral    SpO2: 100%  98% 98%  Weight:      Height:        Intake/Output Summary (Last 24 hours) at 04/19/2020 1247 Last data filed at 04/19/2020 1207 Gross per 24 hour  Intake 4752.75 ml  Output 0 ml  Net 4752.75 ml   Filed Weights   04/17/20 1508 04/18/20 2055  Weight: 76.7 kg 81.8 kg    Examination:  General exam: NAD Respiratory system: CTA Cardiovascular system: S 1, S 2  RRR Gastrointestinal system: BS present, soft, nt Central nervous system: Alert Extremities: Symmetric power Skin: no rashes.   Data Reviewed: I have personally reviewed following labs and imaging studies  CBC: Recent Labs  Lab 04/17/20 1320 04/17/20 1320 04/17/20 1645 04/18/20 0857 04/18/20 2018 04/19/20 0424 04/19/20 0932  WBC 23.4*  --  21.8* 18.7*  --   --  28.8*  NEUTROABS  --   --  18.9*  --   --   --   --   HGB 7.0*   < > 6.7* 7.3* 9.7* 7.8* 8.7*  HCT 22.5*   < > 22.0* 22.8* 29.7* 23.6* 26.5*  MCV 73.8*  --  75.1* 72.8*  --   --  73.8*  PLT 565*  --  499* 468*  --   --  475*   < > = values in this interval not displayed.   Basic Metabolic Panel: Recent Labs  Lab 04/17/20 1321 04/17/20 1645 04/18/20 0857 04/19/20 0932  NA 133* 130* 136 136  K 3.0* 3.1* 3.3* 3.3*  CL 97* 95* 104 103  CO2 23 21* 24 21*  GLUCOSE 115* 97 109* 126*  BUN 5* 5* <5* <5*  CREATININE 0.95 0.94 0.78 0.93  CALCIUM 9.0 8.9 8.7* 8.8*  MG  --   --  1.8  --    GFR: Estimated Creatinine Clearance: 95.6 mL/min (by C-G formula based on SCr of 0.93 mg/dL). Liver Function Tests: Recent Labs  Lab 04/17/20 1645 04/18/20 2018  AST 20 21  ALT 15 13  ALKPHOS 77 82  BILITOT 0.8 1.1  PROT 8.0 8.3*  ALBUMIN 2.7* 2.6*   Recent Labs  Lab 04/18/20 2018  LIPASE 18   No results for input(s): AMMONIA in the last 168 hours. Coagulation Profile: Recent Labs  Lab 04/17/20 1645  INR 1.2   Cardiac Enzymes: No results for input(s): CKTOTAL, CKMB, CKMBINDEX, TROPONINI in the last 168 hours. BNP (last 3 results) No results for input(s): PROBNP in the last 8760 hours. HbA1C: No results for input(s): HGBA1C in the last 72 hours. CBG: No results for input(s): GLUCAP in the last 168 hours. Lipid Profile: No results for input(s): CHOL, HDL, LDLCALC, TRIG, CHOLHDL, LDLDIRECT in the last 72 hours. Thyroid Function Tests: No results for input(s): TSH, T4TOTAL, FREET4, T3FREE, THYROIDAB in the last  72 hours. Anemia Panel: Recent Labs    04/17/20 1955 04/17/20 1958  VITAMINB12 618  --   FOLATE 10.5  --   FERRITIN 170  --   TIBC 195*  --   IRON 26*  --   RETICCTPCT  --  1.3   Sepsis Labs: Recent Labs  Lab 04/17/20 1618 04/17/20 1958 04/19/20 1116  LATICACIDVEN  1.4 1.1 1.1    Recent Results (from the past 240 hour(s))  Blood culture (routine single)     Status: None (Preliminary result)   Collection Time: 04/17/20  5:00 PM   Specimen: BLOOD  Result Value Ref Range Status   Specimen Description BLOOD SITE NOT SPECIFIED  Final   Special Requests   Final    BOTTLES DRAWN AEROBIC AND ANAEROBIC Blood Culture adequate volume   Culture   Final    NO GROWTH 2 DAYS Performed at [facility], [address] [place], [place] [address]    Report Status PENDING  Incomplete  Urine culture     Status: Abnormal   Collection Time: 04/17/20  6:58 PM   Specimen: In/Out Cath Urine  Result Value Ref Range Status   Specimen Description IN/OUT CATH URINE  Final   Special Requests   Final    NONE Performed at [facility], [place] [address] [place], [place] [address]    Culture MULTIPLE SPECIES PRESENT, SUGGEST RECOLLECTION (A)  Final   Report Status 04/19/2020 FINAL  Final  Wet prep, genital     Status: Abnormal   Collection Time: 04/17/20  7:13 PM  Result Value Ref Range Status   Yeast Wet Prep HPF POC NONE SEEN NONE SEEN Final   Trich, Wet Prep NONE SEEN NONE SEEN Final   Clue Cells Wet Prep HPF POC NONE SEEN NONE SEEN Final   WBC, Wet Prep HPF POC FEW (A) NONE SEEN Final   Sperm NONE SEEN  Final    Comment: Performed at [facility], [place] [address], [place], [place] [address]  SARS Coronavirus 2 by RT PCR (hospital order, performed in [facility] hospital lab) Nasopharyngeal Nasopharyngeal Swab     Status: None   Collection Time: 04/17/20  7:55 PM   Specimen: Nasopharyngeal Swab  Result Value Ref Range Status   SARS Coronavirus 2 NEGATIVE NEGATIVE Final     Comment: (NOTE) SARS-CoV-2 target nucleic acids are NOT DETECTED.  The SARS-CoV-2 RNA is generally detectable in upper and lower respiratory specimens during the acute phase of infection. The lowest concentration of SARS-CoV-2 viral copies this assay can detect is 250 copies / mL. A negative result does not preclude SARS-CoV-2 infection and should not be used as the sole basis for treatment or other patient management decisions.  A negative result may occur with improper specimen collection / handling, submission of specimen other than nasopharyngeal swab, presence of viral mutation(s) within the areas targeted by this assay, and inadequate number of viral copies (<250 copies / mL). A negative result must be combined with clinical observations, patient history, and epidemiological information.  Fact Sheet for Patients:   StrictlyIdeas.no  Fact Sheet for Healthcare Providers: BankingDealers.co.za  This test is not yet approved or  cleared by the [place] FDA and has been authorized for detection and/or diagnosis of SARS-CoV-2 by FDA under an Emergency Use Authorization (EUA).  This EUA will remain in effect (meaning this test can be used) for the duration of the COVID-19 declaration under Section 564(b)(1) of the Act, 21 U.S.C. section 360bbb-3(b)(1), unless the authorization is terminated or revoked sooner.  Performed at [facility], [place] [address], [place], [place] [address]   Culture, blood (single)     Status: None (Preliminary result)   Collection Time: 04/18/20  2:38 AM   Specimen: BLOOD  Result Value Ref Range Status   Specimen Description BLOOD RIGHT FOREARM  Final   Special Requests   Final    BOTTLES DRAWN AEROBIC AND ANAEROBIC Blood Culture adequate volume   Culture   Final    NO GROWTH 1 DAY Performed at [facility], [place] [address], [place], [place] [address]    Report Status PENDING  Incomplete           Radiology Studies: DG Chest 2 View  Result Date: 04/17/2020 CLINICAL DATA:  Shortness of breath.  Dizziness. EXAM: CHEST - 2 VIEW COMPARISON:  None. FINDINGS: The cardiomediastinal contours are normal. The lungs are clear. Pulmonary vasculature is normal. No consolidation, pleural effusion, or pneumothorax. No acute osseous abnormalities are seen. IMPRESSION: Negative radiographs of the chest. Electronically Signed   By: Keith Rake M.D.   On: 04/17/2020 14:52   US Pelvis Complete  Result Date: 04/17/2020 CLINICAL DATA:  34 year old female with vaginal discharge. EXAM: TRANSABDOMINAL ULTRASOUND OF PELVIS TECHNIQUE: Transabdominal ultrasound examination of the pelvis was performed including evaluation of the uterus, ovaries, adnexal regions, and pelvic cul-de-sac. COMPARISON:  Pelvic ultrasound dated 02/11/2020. FINDINGS: Uterus Measurements: 15.7 x 10.8 x 14.5 cm = volume: 1273 mL. The uterus is enlarged, heterogeneous, and myomatous. The uterus is poorly visualized due to shadowing called by calcified fibroid. There is a 12.2 x 9.4 x 12.4 cm upper body or fundal intramural fibroid with probable submucosal component. A 7.9 x 6.3 x 8.6 cm partially exophytic posterior body fibroid is also noted. Several additional fibroids are likely present but poorly visualized. Endometrium The endometrium is not visualized. Right ovary Measurements: 3.0 x 1.7 x 2.6 cm = volume: 7.1 mL. Normal appearance/no adnexal mass. Left ovary The left ovary is not visualized. Other findings:  No abnormal free fluid. IMPRESSION: 1. Enlarged myomatous uterus. 2. Unremarkable right ovary. 3. Nonvisualization of the endometrium and the left ovary. Electronically Signed   By: Anner Crete M.D.   On: 04/17/2020 20:36        Scheduled Meds: . sodium chloride   Intravenous Once  . sodium chloride   Intravenous Once  . feeding supplement  1 Container Oral BID BM  . multivitamin with minerals  1 tablet Oral Daily    Continuous Infusions: . sodium chloride 125 mL/hr at 04/19/20 1207  . lactated ringers Stopped (04/19/20 0811)  . piperacillin-tazobactam 12.5 mL/hr at 04/19/20 1207  . vancomycin     Followed by  . vancomycin       LOS: 1 day    Time spent: 35 minutes.     Elmarie Shiley, MD [facility]   If 7PM-7AM, please contact night-coverage [email or web address]  04/19/2020, 12:47 PM

## 2020-04-19 NOTE — Progress Notes (Signed)
Pharmacy Antibiotic Note  Sandra Gregory is a 34 y.o. female admitted on 04/17/2020 with sepsis.  Pharmacy has been consulted for vancomycin dosing.  Patient was evaluated for preop testing prior to myomectomy on 7/30.  Patient initially met sepsis criteria on admit.  WBC normalized after receiving Zosyn which was started 7/31.  She has remained febrile over the weekend, and today her WBC increased from 18 > 28.    Plan: Give vancomycin 1750 mg x1 Vancomycin 1029m IV every 8 hours.  Goal trough 15-20 mcg/mL. Per nomogram dosing.  Check VT as indicated.  Height: _0  (170.2 cm) Weight: 81.8 kg (180 lb 5.4 oz) IBW/kg (Calculated) : 61.6  Temp (24hrs), Avg:100 F (37.8 C), Min:99.1 F (37.3 C), Max:101 F (38.3 C)  Recent Labs  Lab 04/17/20 1320 04/17/20 1321 04/17/20 1618 04/17/20 1645 04/17/20 1958 04/18/20 0857 04/19/20 0932  WBC 23.4*  --   --  21.8*  --  18.7* 28.8*  CREATININE  --  0.95  --  0.94  --  0.78 0.93  LATICACIDVEN  --   --  1.4  --  1.1  --   --     Estimated Creatinine Clearance: 95.6 mL/min (by C-G formula based on SCr of 0.93 mg/dL).    No Known Allergies  Antimicrobials this admission: Zosyn 7/31 >>  Fluconazole 7/31 >> 8/1 for patient reported vaginal discharge   Microbiology results: 7/30 BCx: ngtd 7/30 UCx: needs recollection, mult species present 8/1 UCx: pending  Thank you for allowing pharmacy to be a part of this patient's care.  MDimple Nanas PharmD PGY-1 Acute Care Pharmacy Resident Office: [phone]/09/2019 11:42 AM

## 2020-04-20 LAB — URINE CULTURE: Culture: NO GROWTH

## 2020-04-20 LAB — COMPREHENSIVE METABOLIC PANEL
ALT: 12 U/L (ref 0–44)
AST: 15 U/L (ref 15–41)
Albumin: 2.2 g/dL — ABNORMAL LOW (ref 3.5–5.0)
Alkaline Phosphatase: 84 U/L (ref 38–126)
Anion gap: 12 (ref 5–15)
BUN: <5 mg/dL — ABNORMAL LOW (ref 6–20)
CO2: 19 mmol/L — ABNORMAL LOW (ref 22–32)
Calcium: 8.5 mg/dL — ABNORMAL LOW (ref 8.9–10.3)
Chloride: 107 mmol/L (ref 98–111)
Creatinine, Ser: 0.82 mg/dL (ref 0.44–1.00)
GFR calc Af Amer: >60 mL/min (ref >60)
GFR calc non Af Amer: >60 mL/min (ref >60)
Glucose, Bld: 96 mg/dL (ref 70–99)
Potassium: 3.1 mmol/L — ABNORMAL LOW (ref 3.5–5.1)
Sodium: 138 mmol/L (ref 135–145)
Total Bilirubin: 0.8 mg/dL (ref 0.3–1.2)
Total Protein: 7.3 g/dL (ref 6.5–8.1)

## 2020-04-20 LAB — CBC
HCT: 27.1 % — ABNORMAL LOW (ref 36.0–46.0)
Hemoglobin: 8.5 g/dL — ABNORMAL LOW (ref 12.0–15.0)
MCH: 24 pg — ABNORMAL LOW (ref 26.0–34.0)
MCHC: 31.4 g/dL (ref 30.0–36.0)
MCV: 76.6 fL — ABNORMAL LOW (ref 80.0–100.0)
Platelets: 472 10*3/uL — ABNORMAL HIGH (ref 150–400)
RBC: 3.54 MIL/uL — ABNORMAL LOW (ref 3.87–5.11)
RDW: 20.1 % — ABNORMAL HIGH (ref 11.5–15.5)
WBC: 26.7 10*3/uL — ABNORMAL HIGH (ref 4.0–10.5)
nRBC: 0 % (ref 0.0–0.2)

## 2020-04-20 LAB — GC/CHLAMYDIA PROBE AMP (~~LOC~~) NOT AT ARMC
Chlamydia: NEGATIVE
Comment: NEGATIVE
Comment: NORMAL
Neisseria Gonorrhea: NEGATIVE

## 2020-04-20 LAB — SURGICAL PCR SCREEN
MRSA, PCR: NEGATIVE
Staphylococcus aureus: NEGATIVE

## 2020-04-20 MED ORDER — POTASSIUM CHLORIDE CRYS ER 20 MEQ PO TBCR
40.0000 meq | EXTENDED_RELEASE_TABLET | Freq: Once | ORAL | Status: AC
  Administered 2020-04-20: 40 meq via ORAL
  Filled 2020-04-20: qty 2

## 2020-04-20 MED ORDER — POVIDONE-IODINE 10 % EX SWAB: 2.0000 "application " | Freq: Once | CUTANEOUS | Status: DC

## 2020-04-20 MED ORDER — LACTATED RINGERS IV SOLN: INTRAVENOUS | Status: DC

## 2020-04-20 MED ORDER — SOD CITRATE-CITRIC ACID 500-334 MG/5ML PO SOLN
30.0000 mL | ORAL | Status: AC
  Administered 2020-04-21: 30 mL via ORAL
  Filled 2020-04-20: qty 30

## 2020-04-20 MED ORDER — ORAL CARE MOUTH RINSE: 15.0000 mL | Freq: Once | OROMUCOSAL | Status: AC

## 2020-04-20 MED ORDER — GABAPENTIN 300 MG PO CAPS
300.0000 mg | ORAL_CAPSULE | ORAL | Status: AC
  Administered 2020-04-21: 300 mg via ORAL
  Filled 2020-04-20: qty 1

## 2020-04-20 MED ORDER — CHLORHEXIDINE GLUCONATE 0.12 % MT SOLN
15.0000 mL | Freq: Once | OROMUCOSAL | Status: AC
  Administered 2020-04-21: 15 mL via OROMUCOSAL

## 2020-04-20 MED ORDER — ACETAMINOPHEN 500 MG PO TABS
1000.0000 mg | ORAL_TABLET | ORAL | Status: AC
  Administered 2020-04-21: 1000 mg via ORAL
  Filled 2020-04-20: qty 2

## 2020-04-20 MED ORDER — CEFAZOLIN SODIUM-DEXTROSE 2-4 GM/100ML-% IV SOLN
2.0000 g | INTRAVENOUS | Status: AC
  Administered 2020-04-21: 2 g via INTRAVENOUS
  Filled 2020-04-20: qty 100

## 2020-04-20 NOTE — Progress Notes (Signed)
PROGRESS NOTE    Sandra Gregory  WSF:[id number] DOB: Oct 20, 1985 DOA: 04/17/2020 PCP: Default, Provider, MD   Brief Narrative: 34 year old with past medical history of miscarriage may  25, since then she cannot stand for long time because of dizziness.  She report lightheadedness.  She has been feeling weak, she report weight loss.  She report shortness of breath with activity.  She presented today for preop testing for myomectomy.  Patient was found to be tachycardic heart rate up to 140, temperature 102.  She was sent to the ER.  She met sepsis criteria.  White blood cell count of 21 and hemoglobin of 6.7.  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  She has several large fibroids with up to 12 cm long.    Assessment & Plan:   Principal Problem:   Fever Active Problems:   Fibroids   Tachycardia   Iron deficiency anemia due to chronic blood loss   Symptomatic anemia  1-Symptomatic anemia due to iron deficiency likely from menorrhagia: Received 2 units of packed red blood cells 7/31. Iron 26, ferritin 170 She will need IV iron when source of infection is controlled.  Bilirubin normal.  Hb stable 8.7--8.5 Continue with oral ferrous sulfate.   2-Sepsis: Patient presented with fever tachycardia leukocytosis. Covid 19 Negative.  Chest X-ray: Negative UA; moderate leukocytes, white blood cells 6-10. Pelvis ultrasound: Enlarged myomatous uterus, unremarkable right ovary, nonvisualization of the endometrium and left ovary. Urine culture: multiple morphology, resend urine culture: no growth to date.  Blood culture; No growth to date.  Ct abdomen pelvis: Markedly enlarged fibroid uterus measuring 22 cm, dominant fibroid with lower uterine segment 10.2 cm containing multiple foci of air superimposed infection not excluded.  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  C diff negative.  GYN consulted. Awaiting Dr Maylene Roes evaluation for  surgery.  ID following.  Antibiotics change to ceftriaxone and flagyl by ID on 8/01. WBC down to 26 from 28.   3-Hypokalemia: replete orally.   4-Fibroid menorrhagia: Per OB Abdominal pain thought to be related to fibroid.  Lipase normal.   5-Tachycardia; form Dehydration, anemia, fever.  IV fluids, tylenol.  6--Diarrhea; C diff negative. CT scan non revealing. GI pathogen ordered.   Estimated body mass index is 28.24 kg/m as calculated from the following:   Height as of this encounter: 5' 7"  (1.702 m).   Weight as of this encounter: 81.8 kg.   DVT prophylaxis: SCD Code Status: SCDs Family Communication: Wife at bedside Disposition Plan:  Status is: Inpatient  Dispo: The patient is from: Home              Anticipated d/c is to: Home              Anticipated d/c date is: 2 days              Patient currently is not medically stable to d/c.        Consultants:   None  Procedures:   None  Antimicrobials:    Subjective: She vomited today, feels better after nausea medicine. No RUQ pain.   Objective: Vitals:   04/19/20 1700 04/19/20 2056 04/20/20 0647 04/20/20 0935  BP: 110/75 105/68 111/76 114/78  Pulse: 101 (!) 112 95 98  Resp:  16 22 18   Temp: 99.8 F (37.7 C) 100.3 F (37.9 C) 99.3 F (37.4 C) 99.5 F (37.5 C)  TempSrc: Oral Oral Oral Oral  SpO2: 100% 99% 99% 100%  Weight:      Height:        Intake/Output Summary (Last 24 hours) at 04/20/2020 1101 Last data filed at 04/20/2020 0600 Gross per 24 hour  Intake 4651.54 ml  Output 0 ml  Net 4651.54 ml   Filed Weights   04/17/20 1508 04/18/20 2055  Weight: 76.7 kg 81.8 kg    Examination:  General exam: NAD Respiratory system: CTA Cardiovascular system: S 1, S 2 RRR Gastrointestinal system: BS present, soft, nt Central nervous system: Alert Extremities: no edema  Data Reviewed: I have personally reviewed following labs and imaging studies  CBC: Recent Labs  Lab 04/17/20 1320  04/17/20 1320 04/17/20 1645 04/17/20 1645 04/18/20 0857 04/18/20 2018 04/19/20 0424 04/19/20 0932 04/20/20 0912  WBC 23.4*  --  21.8*  --  18.7*  --   --  28.8* 26.7*  NEUTROABS  --   --  18.9*  --   --   --   --   --   --   HGB 7.0*   < > 6.7*   < > 7.3* 9.7* 7.8* 8.7* 8.5*  HCT 22.5*   < > 22.0*   < > 22.8* 29.7* 23.6* 26.5* 27.1*  MCV 73.8*  --  75.1*  --  72.8*  --   --  73.8* 76.6*  PLT 565*  --  499*  --  468*  --   --  475* 472*   < > = values in this interval not displayed.   Basic Metabolic Panel: Recent Labs  Lab 04/17/20 1321 04/17/20 1645 04/18/20 0857 04/19/20 0932 04/20/20 0912  NA 133* 130* 136 136 138  K 3.0* 3.1* 3.3* 3.3* 3.1*  CL 97* 95* 104 103 107  CO2 23 21* 24 21* 19*  GLUCOSE 115* 97 109* 126* 96  BUN 5* 5* <5* <5* <5*  CREATININE 0.95 0.94 0.78 0.93 0.82  CALCIUM 9.0 8.9 8.7* 8.8* 8.5*  MG  --   --  1.8  --   --    GFR: Estimated Creatinine Clearance: 108.4 mL/min (by C-G formula based on SCr of 0.82 mg/dL). Liver Function Tests: Recent Labs  Lab 04/17/20 1645 04/18/20 2018 04/20/20 0912  AST 20 21 15   ALT 15 13 12   ALKPHOS 77 82 84  BILITOT 0.8 1.1 0.8  PROT 8.0 8.3* 7.3  ALBUMIN 2.7* 2.6* 2.2*   Recent Labs  Lab 04/18/20 2018  LIPASE 18   No results for input(s): AMMONIA in the last 168 hours. Coagulation Profile: Recent Labs  Lab 04/17/20 1645  INR 1.2   Cardiac Enzymes: No results for input(s): CKTOTAL, CKMB, CKMBINDEX, TROPONINI in the last 168 hours. BNP (last 3 results) No results for input(s): PROBNP in the last 8760 hours. HbA1C: No results for input(s): HGBA1C in the last 72 hours. CBG: No results for input(s): GLUCAP in the last 168 hours. Lipid Profile: No results for input(s): CHOL, HDL, LDLCALC, TRIG, CHOLHDL, LDLDIRECT in the last 72 hours. Thyroid Function Tests: No results for input(s): TSH, T4TOTAL, FREET4, T3FREE, THYROIDAB in the last 72 hours. Anemia Panel: Recent Labs    04/17/20 1955  04/17/20 1958  VITAMINB12 618  --   FOLATE 10.5  --   FERRITIN 170  --   TIBC 195*  --   IRON 26*  --   RETICCTPCT  --  1.3   Sepsis Labs: Recent Labs  Lab 04/17/20 1618 04/17/20 1958 04/19/20 1116  LATICACIDVEN 1.4 1.1 1.1    Recent Results (  from the past 240 hour(s))  Blood culture (routine single)     Status: None (Preliminary result)   Collection Time: 04/17/20  5:00 PM   Specimen: BLOOD  Result Value Ref Range Status   Specimen Description BLOOD SITE NOT SPECIFIED  Final   Special Requests   Final    BOTTLES DRAWN AEROBIC AND ANAEROBIC Blood Culture adequate volume   Culture   Final    NO GROWTH 2 DAYS Performed at [facility], [address], [place], [place] [address]    Report Status PENDING  Incomplete  Urine culture     Status: Abnormal   Collection Time: 04/17/20  6:58 PM   Specimen: In/Out Cath Urine  Result Value Ref Range Status   Specimen Description IN/OUT CATH URINE  Final   Special Requests   Final    NONE Performed at [facility], [place] [address], [place], [place] [address]    Culture MULTIPLE SPECIES PRESENT, SUGGEST RECOLLECTION (A)  Final   Report Status 04/19/2020 FINAL  Final  Wet prep, genital     Status: Abnormal   Collection Time: 04/17/20  7:13 PM  Result Value Ref Range Status   Yeast Wet Prep HPF POC NONE SEEN NONE SEEN Final   Trich, Wet Prep NONE SEEN NONE SEEN Final   Clue Cells Wet Prep HPF POC NONE SEEN NONE SEEN Final   WBC, Wet Prep HPF POC FEW (A) NONE SEEN Final   Sperm NONE SEEN  Final    Comment: Performed at [facility], [place] [address], [place], [place] [address]  SARS Coronavirus 2 by RT PCR (hospital order, performed in [facility] hospital lab) Nasopharyngeal Nasopharyngeal Swab     Status: None   Collection Time: 04/17/20  7:55 PM   Specimen: Nasopharyngeal Swab  Result Value Ref Range Status   SARS Coronavirus 2 NEGATIVE NEGATIVE Final    Comment: (NOTE) SARS-CoV-2 target nucleic acids are NOT  DETECTED.  The SARS-CoV-2 RNA is generally detectable in upper and lower respiratory specimens during the acute phase of infection. The lowest concentration of SARS-CoV-2 viral copies this assay can detect is 250 copies / mL. A negative result does not preclude SARS-CoV-2 infection and should not be used as the sole basis for treatment or other patient management decisions.  A negative result may occur with improper specimen collection / handling, submission of specimen other than nasopharyngeal swab, presence of viral mutation(s) within the areas targeted by this assay, and inadequate number of viral copies (<250 copies / mL). A negative result must be combined with clinical observations, patient history, and epidemiological information.  Fact Sheet for Patients:   StrictlyIdeas.no  Fact Sheet for Healthcare Providers: BankingDealers.co.za  This test is not yet approved or  cleared by the [place] FDA and has been authorized for detection and/or diagnosis of SARS-CoV-2 by FDA under an Emergency Use Authorization (EUA).  This EUA will remain in effect (meaning this test can be used) for the duration of the COVID-19 declaration under Section 564(b)(1) of the Act, 21 U.S.C. section 360bbb-3(b)(1), unless the authorization is terminated or revoked sooner.  Performed at [facility], [place] [address] [place], [place] [address]   Culture, blood (single)     Status: None (Preliminary result)   Collection Time: 04/18/20  2:38 AM   Specimen: BLOOD  Result Value Ref Range Status   Specimen Description BLOOD RIGHT FOREARM  Final   Special Requests   Final  BOTTLES DRAWN AEROBIC AND ANAEROBIC Blood Culture adequate volume   Culture   Final    NO GROWTH 1 DAY Performed at [facility], [place] [address], [place], [place] [address]    Report Status PENDING  Incomplete  Urine Culture     Status: None   Collection Time:  04/19/20 11:05 AM   Specimen: Urine, Random  Result Value Ref Range Status   Specimen Description URINE, RANDOM  Final   Special Requests NONE  Final   Culture   Final    NO GROWTH Performed at [facility], [place] [address], [place], [place] [address]    Report Status 04/20/2020 FINAL  Final  C Difficile Quick Screen w PCR reflex     Status: None   Collection Time: 04/19/20  2:37 PM   Specimen: STOOL  Result Value Ref Range Status   C Diff antigen NEGATIVE NEGATIVE Final   C Diff toxin NEGATIVE NEGATIVE Final   C Diff interpretation No C. difficile detected.  Final    Comment: Performed at [facility], [place] [address], [place], [place] [address]  Surgical pcr screen     Status: None   Collection Time: 04/20/20  3:23 AM   Specimen: Nasal Mucosa; Nasal Swab  Result Value Ref Range Status   MRSA, PCR NEGATIVE NEGATIVE Final   Staphylococcus aureus NEGATIVE NEGATIVE Final    Comment: (NOTE) The Xpert SA Assay (FDA approved for NASAL specimens in patients 44 years of age and older), is one component of a comprehensive surveillance program. It is not intended to diagnose infection nor to guide or monitor treatment. Performed at [facility], [place] [address] [place], [place] [address]          Radiology Studies: CT ABDOMEN PELVIS W CONTRAST  Result Date: 04/19/2020 CLINICAL DATA:  Abdominal pain and fever. EXAM: CT ABDOMEN AND PELVIS WITH CONTRAST TECHNIQUE: Multidetector CT imaging of the abdomen and pelvis was performed using the standard protocol following bolus administration of intravenous contrast. CONTRAST:  156m OMNIPAQUE IOHEXOL 300 MG/ML  SOLN COMPARISON:  Pelvic ultrasound-04/17/2020; pelvic MRI-12/23/2019 FINDINGS: Lower chest: Limited visualization of the lower thorax demonstrates minimal dependent subpleural ground-glass atelectasis. No discrete focal airspace opacities. Normal heart size.  No pericardial effusion. Hepatobiliary: Normal hepatic  contour. No discrete hepatic lesions. Potential layering radiopaque debris within otherwise normal-appearing gallbladder as could be seen in the setting of gallbladder sludge (image 38, series 3). No definitive gallbladder wall thickening or pericholecystic fluid. No radiopaque gallstones. No intra extrahepatic bili duct dilatation. No ascites. Pancreas: Normal appearance of the pancreas. Spleen: Normal appearance of the spleen. Adrenals/Urinary Tract: Moderate bilateral grossly symmetric ureterectasis and pelvicaliectasis secondary to mass effect of the markedly enlarged myomatous uterus on the bilateral ureters, new compared to abdominal/pelvic MRI performed 01/22/2020. No renal stones this postcontrast examination. No discrete renal lesions. No perinephric stranding. Normal appearance of the bilateral adrenal glands. There is mass effect of the fibroid uterus upon an otherwise normal-appearing urinary bladder. Stomach/Bowel: There is mass effect on the sigmoid colon by the markedly enlarged myomatous uterus. Ingested enteric contrast extends to the level of the ascending colon. No evidence of enteric obstruction. No discrete areas of bowel wall thickening. Normal appearance of the terminal ileum. The appendix is not visualized, however there is no pericecal inflammatory change. No pneumoperitoneum, pneumatosis or portal venous gas. Vascular/Lymphatic: Normal caliber the abdominal aorta. The major branch vessels of the abdominal aorta appear patent on this  non CTA examination. No bulky retroperitoneal, mesenteric, pelvic or inguinal lymphadenopathy. Reproductive: The uterus is markedly enlarged measuring approximately 22.2 x 16.3 x 9.4 cm (sagittal image 60, series 7; axial image 54, series 3) as it is replaced with multiple hypertrophied hypoenhancing uterine fibroids. There is a dominant fibroid within the lower uterine segment which measures approximately 10.2 x 10.0 x 9.5 cm (sagittal image 68, series 7; axial  image 65, series 3) and contains several foci of intraluminal air as well as mixed attenuating debris, which given provided history of fever, superimposed infection is not excluded. Expected hypertrophy of the bilateral gonadal veins. No discrete adnexal lesions. Other: Minimal subcutaneous edema about the midline of the low back. Musculoskeletal: No acute or aggressive osseous abnormalities. IMPRESSION: 1. Markedly enlarged fibroid uterus measuring 22.2 cm in maximal diameter. 2. A dominant fibroid within the lower uterine segment measuring 10.2 cm contains multiple foci of air, nonspecific though given provided history of fever, superimposed infection is not excluded. 3. Moderate bilateral ureterectasis and pelvicaliectasis secondary to mass effect of the myomatous uterus on the bilateral ureters, new compared to abdominal/pelvic MRI performed 12/24/2019. 4. Potential gallbladder sludge within an otherwise normal-appearing gallbladder. Critical Value/emergent results were called by telephone at the time of interpretation on 04/19/2020 at 1:41 pm to provider [facility] , who verbally acknowledged these results. Electronically Signed   By: Sandi Mariscal M.D.   On: 04/19/2020 13:45        Scheduled Meds:  sodium chloride   Intravenous Once   sodium chloride   Intravenous Once   feeding supplement  1 Container Oral BID BM   ferrous sulfate  325 mg Oral Q breakfast   multivitamin with minerals  1 tablet Oral Daily   Continuous Infusions:  sodium chloride 125 mL/hr at 04/20/20 0506   cefTRIAXone (ROCEPHIN)  IV 2 g (04/19/20 2101)   metronidazole 500 mg (04/20/20 0946)     LOS: 2 days    Time spent: 35 minutes.     Elmarie Shiley, MD [facility]   If 7PM-7AM, please contact night-coverage [email or web address]  04/20/2020, 11:01 AM

## 2020-04-20 NOTE — Progress Notes (Signed)
Subjective: Patient reports that she is feeling better since upon admission. Pt is well known to me. I have been aware of her status over the weekend through Dr. Harolyn Rutherford and Trinna Post. Pt has some diarrhea after drinking Ensure but, not further episodes.    Pt and her partner were attempting conception earlier this year and was able to conceive using a sperm donor. She desired to maintain her fertility but reports that she is worried about er health first. She has a daughter at home.    Objective: I have reviewed patient's vital signs, intake and output, medications, labs, microbiology and radiology results.  General: alert and no distress Resp: no increased work of breathing.  GI: NT, ND, mass palpable to 4 cm above umbilicus. Not apprecialbe tender. Mobile. consistent with prev evaluated fibroid uterus.   Extremities: extremities normal, atraumatic, no cyanosis or edema  CBC Latest Ref Rng & Units 04/20/2020 04/19/2020 04/19/2020  WBC 4.0 - 10.5 K/uL 26.7(H) 28.8(H) -  Hemoglobin 12.0 - 15.0 g/dL 8.5(L) 8.7(L) 7.8(L)  Hematocrit 36 - 46 % 27.1(L) 26.5(L) 23.6(L)  Platelets 150 - 400 K/uL 472(H) 475(H) -   04/19/2020 CLINICAL DATA:  Abdominal pain and fever.  EXAM: CT ABDOMEN AND PELVIS WITH CONTRAST  TECHNIQUE: Multidetector CT imaging of the abdomen and pelvis was performed using the standard protocol following bolus administration of intravenous contrast.  CONTRAST:  17mL OMNIPAQUE IOHEXOL 300 MG/ML  SOLN  COMPARISON:  Pelvic ultrasound-04/17/2020; pelvic MRI-12/23/2019  FINDINGS: Lower chest: Limited visualization of the lower thorax demonstrates minimal dependent subpleural ground-glass atelectasis. No discrete focal airspace opacities.  Normal heart size.  No pericardial effusion.  Hepatobiliary: Normal hepatic contour. No discrete hepatic lesions. Potential layering radiopaque debris within otherwise normal-appearing gallbladder as could be seen in the setting  of gallbladder sludge (image 38, series 3). No definitive gallbladder wall thickening or pericholecystic fluid. No radiopaque gallstones. No intra extrahepatic bili duct dilatation. No ascites.  Pancreas: Normal appearance of the pancreas.  Spleen: Normal appearance of the spleen.  Adrenals/Urinary Tract: Moderate bilateral grossly symmetric ureterectasis and pelvicaliectasis secondary to mass effect of the markedly enlarged myomatous uterus on the bilateral ureters, new compared to abdominal/pelvic MRI performed 01/22/2020. No renal stones this postcontrast examination. No discrete renal lesions. No perinephric stranding.  Normal appearance of the bilateral adrenal glands. There is mass effect of the fibroid uterus upon an otherwise normal-appearing urinary bladder.  Stomach/Bowel: There is mass effect on the sigmoid colon by the markedly enlarged myomatous uterus. Ingested enteric contrast extends to the level of the ascending colon. No evidence of enteric obstruction. No discrete areas of bowel wall thickening. Normal appearance of the terminal ileum. The appendix is not visualized, however there is no pericecal inflammatory change. No pneumoperitoneum, pneumatosis or portal venous gas.  Vascular/Lymphatic: Normal caliber the abdominal aorta. The major branch vessels of the abdominal aorta appear patent on this non CTA examination.  No bulky retroperitoneal, mesenteric, pelvic or inguinal lymphadenopathy.  Reproductive: The uterus is markedly enlarged measuring approximately 22.2 x 16.3 x 9.4 cm (sagittal image 60, series 7; axial image 54, series 3) as it is replaced with multiple hypertrophied hypoenhancing uterine fibroids.  There is a dominant fibroid within the lower uterine segment which measures approximately 10.2 x 10.0 x 9.5 cm (sagittal image 68, series 7; axial image 65, series 3) and contains several foci of intraluminal air as well as mixed  attenuating debris, which given provided history of fever, superimposed infection is not excluded. Expected hypertrophy  of the bilateral gonadal veins. No discrete adnexal lesions.  Other: Minimal subcutaneous edema about the midline of the low back.  Musculoskeletal: No acute or aggressive osseous abnormalities.  IMPRESSION: 1. Markedly enlarged fibroid uterus measuring 22.2 cm in maximal diameter. 2. A dominant fibroid within the lower uterine segment measuring 10.2 cm contains multiple foci of air, nonspecific though given provided history of fever, superimposed infection is not excluded. 3. Moderate bilateral ureterectasis and pelvicaliectasis secondary to mass effect of the myomatous uterus on the bilateral ureters, new compared to abdominal/pelvic MRI performed 12/24/2019. 4. Potential gallbladder sludge within an otherwise normal-appearing gallbladder.  Blood and urine cx neg. C. Diff neg.    Assessment/Plan: 33 you with fever and leukocytosis of unknown origin. I have discuss with pt all of the results and my concern of an infected uterus. There is no other clear etiology and I am concerned about the multiple foci of air in the central fibroid. I am very concerned about attempting a myomectomy and leaving infection and having the risk of a repeat surgery with hysterectomy in a few days. I spoke with Dr. Baxter Flattery, ID, who agrees with above plan.    I am recommending surgical management with total abdominal hysterectomy with bilateral salpingectomy.Pt agrees with need to proceed.  Will review again in the am ginve the newness of this info and the change in the procedure. The risks of surgery were discussed in detail with the patient including but not limited to: bleeding which may require transfusion or reoperation; infection which may require prolonged hospitalization or re-hospitalization and antibiotic therapy; injury to bowel, bladder, ureters and major vessels or other  surrounding organs; need for additional procedures including laparotomy; thromboembolic phenomenon, incisional problems and other postoperative or anesthesia complications.  Patient was told that the likelihood that her condition and symptoms will be treated effectively with this surgical management was very high; the postoperative expectations were also discussed in detail. The patient also understands the alternative treatment options which were discussed in full. All questions were answered.  NPO after midnight. Pt is to follow all of her preop instructions that were given to her on Friday.   Prior to surgery, I will readdress the concerns and the procedure with pt. She is to get a TAP block prior to the procedure. The OR will be notified that pt is admitted.   Keep IV atbx for now.      LOS: 2 days    Sandra Gregory 04/20/2020, 2:48 PM

## 2020-04-20 NOTE — Progress Notes (Signed)
[facility]  Date of Admission:  04/17/2020     Total days of antibiotics 4         ASSESSMENT:  Sandra Gregory is scheduled for abdominal myomectomy tomorrow. Has had mildly elevated temperature overnight of 100.3. Surgical intervention likely will obtain source control as there is no other clear evidence of infection at present. Will keep with current dose of ceftriaxone and metronidazole. Did have looser stools since starting antibiotics which is likely a side effect of current antibiotics.   PLAN:  1. Continue current dose of ceftriaxone and metronidazole. 2. Surgery planned for tomorrow.  3. Continue to monitor fever curve.   Principal Problem:   Fever Active Problems:   Fibroids   Tachycardia   Iron deficiency anemia due to chronic blood loss   Symptomatic anemia   . sodium chloride   Intravenous Once  . sodium chloride   Intravenous Once  . feeding supplement  1 Container Oral BID BM  . ferrous sulfate  325 mg Oral Q breakfast  . multivitamin with minerals  1 tablet Oral Daily    SUBJECTIVE:  Mildly elevated temperature overnight with no acute events. Partner in room for visit.   No Known Allergies   Review of Systems: Review of Systems  Constitutional: Negative for chills, fever and weight loss.  Respiratory: Negative for cough, shortness of breath and wheezing.   Cardiovascular: Negative for chest pain and leg swelling.  Gastrointestinal: Negative for abdominal pain, constipation, diarrhea, nausea and vomiting.  Skin: Negative for rash.      OBJECTIVE: Vitals:   04/19/20 1700 04/19/20 2056 04/20/20 0647 04/20/20 0935  BP: 110/75 105/68 111/76 114/78  Pulse: 101 (!) 112 95 98  Resp:  16 22 18   Temp: 99.8 F (37.7 C) 100.3 F (37.9 C) 99.3 F (37.4 C) 99.5 F (37.5 C)  TempSrc: Oral Oral Oral Oral  SpO2: 100% 99% 99% 100%  Weight:      Height:       Body mass index is 28.24 kg/m.  Physical Exam Constitutional:       General: She is not in acute distress.    Appearance: She is well-developed.     Comments: Lying in bed with head of bed elevated; pleasant.   Cardiovascular:     Rate and Rhythm: Normal rate and regular rhythm.     Heart sounds: Normal heart sounds.  Pulmonary:     Effort: Pulmonary effort is normal.     Breath sounds: Normal breath sounds.  Abdominal:     General: There is no distension.     Tenderness: There is no abdominal tenderness. There is no guarding or rebound.  Skin:    General: Skin is warm and dry.  Neurological:     Mental Status: She is alert.  Psychiatric:        Mood and Affect: Mood normal.     Lab Results Lab Results  Component Value Date   WBC 26.7 (H) 04/20/2020   HGB 8.5 (L) 04/20/2020   HCT 27.1 (L) 04/20/2020   MCV 76.6 (L) 04/20/2020   PLT 472 (H) 04/20/2020    Lab Results  Component Value Date   CREATININE 0.82 04/20/2020   BUN <5 (L) 04/20/2020   NA 138 04/20/2020   K 3.1 (L) 04/20/2020   CL 107 04/20/2020   CO2 19 (L) 04/20/2020    Lab Results  Component Value Date   ALT 12 04/20/2020   AST 15  04/20/2020   ALKPHOS 84 04/20/2020   BILITOT 0.8 04/20/2020     Microbiology: Recent Results (from the past 240 hour(s))  Blood culture (routine single)     Status: None (Preliminary result)   Collection Time: 04/17/20  5:00 PM   Specimen: BLOOD  Result Value Ref Range Status   Specimen Description BLOOD SITE NOT SPECIFIED  Final   Special Requests   Final    BOTTLES DRAWN AEROBIC AND ANAEROBIC Blood Culture adequate volume   Culture   Final    NO GROWTH 3 DAYS Performed at [facility], [place] [address] [place], [place] [address]    Report Status PENDING  Incomplete  Urine culture     Status: Abnormal   Collection Time: 04/17/20  6:58 PM   Specimen: In/Out Cath Urine  Result Value Ref Range Status   Specimen Description IN/OUT CATH URINE  Final   Special Requests   Final    NONE Performed at [facility], [place]  [address], [place], [place] [address]    Culture MULTIPLE SPECIES PRESENT, SUGGEST RECOLLECTION (A)  Final   Report Status 04/19/2020 FINAL  Final  Wet prep, genital     Status: Abnormal   Collection Time: 04/17/20  7:13 PM  Result Value Ref Range Status   Yeast Wet Prep HPF POC NONE SEEN NONE SEEN Final   Trich, Wet Prep NONE SEEN NONE SEEN Final   Clue Cells Wet Prep HPF POC NONE SEEN NONE SEEN Final   WBC, Wet Prep HPF POC FEW (A) NONE SEEN Final   Sperm NONE SEEN  Final    Comment: Performed at [facility], [place] [address], [place], [place] [address]  SARS Coronavirus 2 by RT PCR (hospital order, performed in [facility] hospital lab) Nasopharyngeal Nasopharyngeal Swab     Status: None   Collection Time: 04/17/20  7:55 PM   Specimen: Nasopharyngeal Swab  Result Value Ref Range Status   SARS Coronavirus 2 NEGATIVE NEGATIVE Final    Comment: (NOTE) SARS-CoV-2 target nucleic acids are NOT DETECTED.  The SARS-CoV-2 RNA is generally detectable in upper and lower respiratory specimens during the acute phase of infection. The lowest concentration of SARS-CoV-2 viral copies this assay can detect is 250 copies / mL. A negative result does not preclude SARS-CoV-2 infection and should not be used as the sole basis for treatment or other patient management decisions.  A negative result may occur with improper specimen collection / handling, submission of specimen other than nasopharyngeal swab, presence of viral mutation(s) within the areas targeted by this assay, and inadequate number of viral copies (<250 copies / mL). A negative result must be combined with clinical observations, patient history, and epidemiological information.  Fact Sheet for Patients:   StrictlyIdeas.no  Fact Sheet for Healthcare Providers: BankingDealers.co.za  This test is not yet approved or  cleared by the [place] FDA and has been authorized for detection  and/or diagnosis of SARS-CoV-2 by FDA under an Emergency Use Authorization (EUA).  This EUA will remain in effect (meaning this test can be used) for the duration of the COVID-19 declaration under Section 564(b)(1) of the Act, 21 U.S.C. section 360bbb-3(b)(1), unless the authorization is terminated or revoked sooner.  Performed at [facility], [place] [address], [place],  [address]   Culture, blood (single)     Status: None (Preliminary result)   Collection Time: 04/18/20  2:38 AM   Specimen: BLOOD  Result Value  Ref Range Status   Specimen Description BLOOD RIGHT FOREARM  Final   Special Requests   Final    BOTTLES DRAWN AEROBIC AND ANAEROBIC Blood Culture adequate volume   Culture   Final    NO GROWTH 2 DAYS Performed at [facility], [address], [place], [place] [address]    Report Status PENDING  Incomplete  Urine Culture     Status: None   Collection Time: 04/19/20 11:05 AM   Specimen: Urine, Random  Result Value Ref Range Status   Specimen Description URINE, RANDOM  Final   Special Requests NONE  Final   Culture   Final    NO GROWTH Performed at [facility], [place] [address] [place], [place] [address]    Report Status 04/20/2020 FINAL  Final  C Difficile Quick Screen w PCR reflex     Status: None   Collection Time: 04/19/20  2:37 PM   Specimen: STOOL  Result Value Ref Range Status   C Diff antigen NEGATIVE NEGATIVE Final   C Diff toxin NEGATIVE NEGATIVE Final   C Diff interpretation No C. difficile detected.  Final    Comment: Performed at [facility], [place] [address], [place], [place] [address]  Surgical pcr screen     Status: None   Collection Time: 04/20/20  3:23 AM   Specimen: Nasal Mucosa; Nasal Swab  Result Value Ref Range Status   MRSA, PCR NEGATIVE NEGATIVE Final   Staphylococcus aureus NEGATIVE NEGATIVE Final    Comment: (NOTE) The Xpert SA Assay (FDA approved for NASAL specimens in patients 72 years of age and older), is  one component of a comprehensive surveillance program. It is not intended to diagnose infection nor to guide or monitor treatment. Performed at [facility], [place] [address], [place], [place] [address]      Terri Piedra, [facility]  04/20/2020  1:42 PM

## 2020-04-20 NOTE — Consult Note (Signed)
[facility] OB[facility]N Attending Follow Up Consult Note  Subjective:  Patient reports feeling better today, not dizzy or lightheaded. Able to ambulate without difficulty. No bleeding and no abdominal pain.   Admitted on 04/17/2020 for Fever, in the setting of symptomatic anemia due to AUB, fibroids.    Objective:  Blood pressure 111/76, pulse 95, temperature 99.3 F (37.4 C), temperature source Oral, resp. rate 22, height 5\' 7"  (1.702 m), weight 81.8 kg, SpO2 99 %, unknown if currently breastfeeding. Gen: NAD HENT: Normocephalic, atraumatic Lungs: Normal respiratory effort Heart: Regular rate noted Abdomen: NT, soft, fibroid uterus palpated Cervix: Deferred Ext: 2+ DTRs, no edema, no cyanosis, negative Homan's sign  Studies: CBC Latest Ref Rng & Units 04/19/2020 04/19/2020 04/18/2020  WBC 4.0 - 10.5 K/uL 28.8(H) - -  Hemoglobin 12.0 - 15.0 g/dL 8.7(L) 7.8(L) 9.7(L)  Hematocrit 36 - 46 % 26.5(L) 23.6(L) 29.7(L)  Platelets 150 - 400 K/uL 475(H) - -    CT ABDOMEN PELVIS W CONTRAST  Result Date: 04/19/2020 CLINICAL DATA:  Abdominal pain and fever. EXAM: CT ABDOMEN AND PELVIS WITH CONTRAST TECHNIQUE: Multidetector CT imaging of the abdomen and pelvis was performed using the standard protocol following bolus administration of intravenous contrast. CONTRAST:  158mL OMNIPAQUE IOHEXOL 300 MG/ML  SOLN COMPARISON:  Pelvic ultrasound-04/17/2020; pelvic MRI-12/23/2019 FINDINGS: Lower chest: Limited visualization of the lower thorax demonstrates minimal dependent subpleural ground-glass atelectasis. No discrete focal airspace opacities. Normal heart size.  No pericardial effusion. Hepatobiliary: Normal hepatic contour. No discrete hepatic lesions. Potential layering radiopaque debris within otherwise normal-appearing gallbladder as could be seen in the setting of gallbladder sludge (image 38, series 3). No definitive gallbladder wall thickening or pericholecystic fluid. No radiopaque gallstones. No  intra extrahepatic bili duct dilatation. No ascites. Pancreas: Normal appearance of the pancreas. Spleen: Normal appearance of the spleen. Adrenals/Urinary Tract: Moderate bilateral grossly symmetric ureterectasis and pelvicaliectasis secondary to mass effect of the markedly enlarged myomatous uterus on the bilateral ureters, new compared to abdominal/pelvic MRI performed 01/22/2020. No renal stones this postcontrast examination. No discrete renal lesions. No perinephric stranding. Normal appearance of the bilateral adrenal glands. There is mass effect of the fibroid uterus upon an otherwise normal-appearing urinary bladder. Stomach/Bowel: There is mass effect on the sigmoid colon by the markedly enlarged myomatous uterus. Ingested enteric contrast extends to the level of the ascending colon. No evidence of enteric obstruction. No discrete areas of bowel wall thickening. Normal appearance of the terminal ileum. The appendix is not visualized, however there is no pericecal inflammatory change. No pneumoperitoneum, pneumatosis or portal venous gas. Vascular/Lymphatic: Normal caliber the abdominal aorta. The major branch vessels of the abdominal aorta appear patent on this non CTA examination. No bulky retroperitoneal, mesenteric, pelvic or inguinal lymphadenopathy. Reproductive: The uterus is markedly enlarged measuring approximately 22.2 x 16.3 x 9.4 cm (sagittal image 60, series 7; axial image 54, series 3) as it is replaced with multiple hypertrophied hypoenhancing uterine fibroids. There is a dominant fibroid within the lower uterine segment which measures approximately 10.2 x 10.0 x 9.5 cm (sagittal image 68, series 7; axial image 65, series 3) and contains several foci of intraluminal air as well as mixed attenuating debris, which given provided history of fever, superimposed infection is not excluded. Expected hypertrophy of the bilateral gonadal veins. No discrete adnexal lesions. Other: Minimal subcutaneous  edema about the midline of the low back. Musculoskeletal: No acute or aggressive osseous abnormalities. IMPRESSION: 1. Markedly enlarged fibroid uterus measuring 22.2 cm in maximal  diameter. 2. A dominant fibroid within the lower uterine segment measuring 10.2 cm contains multiple foci of air, nonspecific though given provided history of fever, superimposed infection is not excluded. 3. Moderate bilateral ureterectasis and pelvicaliectasis secondary to mass effect of the myomatous uterus on the bilateral ureters, new compared to abdominal/pelvic MRI performed 12/24/2019. 4. Potential gallbladder sludge within an otherwise normal-appearing gallbladder. Critical Value/emergent results were called by telephone at the time of interpretation on 04/19/2020 at 1:41 pm to provider [facility] , who verbally acknowledged these results. Electronically Signed   By: Sandi Mariscal M.D.   On: 04/19/2020 13:45    Assessment & Plan:  34 y.o. G2P1011 admitted for fevers of unclear source, leukocytosis.  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  ID following, regimen changed to Ceftriaxone/Flagyl. Dr. Ihor Dow to talk to patient later today about surgical plans. Continue antibiotics and other management as per [facility] team, appreciate their care of our patient. Continue close observation.     Verita Schneiders, MD, [place] for [employer], [place]

## 2020-04-21 ENCOUNTER — Inpatient Hospital Stay (HOSPITAL_COMMUNITY): Payer: 59 | Admitting: Vascular Surgery

## 2020-04-21 ENCOUNTER — Inpatient Hospital Stay (HOSPITAL_COMMUNITY): Payer: 59 | Admitting: Anesthesiology

## 2020-04-21 ENCOUNTER — Inpatient Hospital Stay (HOSPITAL_COMMUNITY): Admission: RE | Admit: 2020-04-21 | Payer: 59 | Source: Home / Self Care | Admitting: Obstetrics & Gynecology

## 2020-04-21 ENCOUNTER — Encounter (HOSPITAL_COMMUNITY)
Admission: EM | Disposition: A | Discharge status: Home / Self Care | Payer: Self-pay | Source: Home / Self Care | Attending: Internal Medicine

## 2020-04-21 ENCOUNTER — Encounter (HOSPITAL_COMMUNITY): Payer: Self-pay | Admitting: Family Medicine

## 2020-04-21 DIAGNOSIS — D259 Leiomyoma of uterus, unspecified: Secondary | ICD-10-CM

## 2020-04-21 HISTORY — PX: ABDOMINAL HYSTERECTOMY: SHX81

## 2020-04-21 LAB — POCT I-STAT, CHEM 8
BUN: <3 mg/dL — ABNORMAL LOW (ref 6–20)
Calcium, Ion: 1.16 mmol/L (ref 1.15–1.40)
Chloride: 105 mmol/L (ref 98–111)
Creatinine, Ser: 0.6 mg/dL (ref 0.44–1.00)
Glucose, Bld: 98 mg/dL (ref 70–99)
HCT: 26 % — ABNORMAL LOW (ref 36.0–46.0)
Hemoglobin: 8.8 g/dL — ABNORMAL LOW (ref 12.0–15.0)
Potassium: 3.1 mmol/L — ABNORMAL LOW (ref 3.5–5.1)
Sodium: 142 mmol/L (ref 135–145)
TCO2: 26 mmol/L (ref 22–32)

## 2020-04-21 LAB — PREPARE RBC (CROSSMATCH)

## 2020-04-21 LAB — BASIC METABOLIC PANEL
Anion gap: 9 (ref 5–15)
BUN: <5 mg/dL — ABNORMAL LOW (ref 6–20)
CO2: 20 mmol/L — ABNORMAL LOW (ref 22–32)
Calcium: 8.2 mg/dL — ABNORMAL LOW (ref 8.9–10.3)
Chloride: 107 mmol/L (ref 98–111)
Creatinine, Ser: 0.77 mg/dL (ref 0.44–1.00)
GFR calc Af Amer: >60 mL/min (ref >60)
GFR calc non Af Amer: >60 mL/min (ref >60)
Glucose, Bld: 86 mg/dL (ref 70–99)
Potassium: 3.1 mmol/L — ABNORMAL LOW (ref 3.5–5.1)
Sodium: 136 mmol/L (ref 135–145)

## 2020-04-21 LAB — CBC
HCT: 23.3 % — ABNORMAL LOW (ref 36.0–46.0)
Hemoglobin: 7.4 g/dL — ABNORMAL LOW (ref 12.0–15.0)
MCH: 23.6 pg — ABNORMAL LOW (ref 26.0–34.0)
MCHC: 31.8 g/dL (ref 30.0–36.0)
MCV: 74.2 fL — ABNORMAL LOW (ref 80.0–100.0)
Platelets: 409 10*3/uL — ABNORMAL HIGH (ref 150–400)
RBC: 3.14 MIL/uL — ABNORMAL LOW (ref 3.87–5.11)
RDW: 20.2 % — ABNORMAL HIGH (ref 11.5–15.5)
WBC: 18 10*3/uL — ABNORMAL HIGH (ref 4.0–10.5)
nRBC: 0 % (ref 0.0–0.2)

## 2020-04-21 SURGERY — HYSTERECTOMY, ABDOMINAL
Anesthesia: General | Site: Abdomen

## 2020-04-21 MED ORDER — BUPIVACAINE-EPINEPHRINE (PF) 0.25% -1:200000 IJ SOLN
INTRAMUSCULAR | Status: DC | PRN
  Administered 2020-04-21 (×2): 25 mL

## 2020-04-21 MED ORDER — ONDANSETRON HCL 4 MG/2ML IJ SOLN: 4.0000 mg | Freq: Four times a day (QID) | INTRAMUSCULAR | Status: DC | PRN

## 2020-04-21 MED ORDER — FENTANYL CITRATE (PF) 100 MCG/2ML IJ SOLN
INTRAMUSCULAR | Status: DC | PRN
  Administered 2020-04-21: 100 ug via INTRAVENOUS
  Administered 2020-04-21 (×3): 50 ug via INTRAVENOUS

## 2020-04-21 MED ORDER — POTASSIUM CHLORIDE CRYS ER 20 MEQ PO TBCR
40.0000 meq | EXTENDED_RELEASE_TABLET | Freq: Once | ORAL | Status: AC
  Administered 2020-04-21: 40 meq via ORAL
  Filled 2020-04-21: qty 2

## 2020-04-21 MED ORDER — 0.9 % SODIUM CHLORIDE (POUR BTL) OPTIME
TOPICAL | Status: DC | PRN
  Administered 2020-04-21: 2000 mL

## 2020-04-21 MED ORDER — SODIUM CHLORIDE 0.9% IV SOLUTION: Freq: Once | INTRAVENOUS | Status: DC

## 2020-04-21 MED ORDER — LACTATED RINGERS IV SOLN: INTRAVENOUS | Status: DC | PRN

## 2020-04-21 MED ORDER — METHYLENE BLUE 0.5 % INJ SOLN
INTRAVENOUS | Status: DC | PRN
  Administered 2020-04-21: 50 mg via INTRAVENOUS

## 2020-04-21 MED ORDER — IBUPROFEN 800 MG PO TABS
800.0000 mg | ORAL_TABLET | Freq: Four times a day (QID) | ORAL | Status: DC
  Administered 2020-04-22 – 2020-04-23 (×3): 800 mg via ORAL
  Filled 2020-04-21 (×3): qty 1

## 2020-04-21 MED ORDER — FENTANYL CITRATE (PF) 100 MCG/2ML IJ SOLN
25.0000 ug | INTRAMUSCULAR | Status: DC | PRN
  Administered 2020-04-21 (×3): 50 ug via INTRAVENOUS

## 2020-04-21 MED ORDER — SIMETHICONE 80 MG PO CHEW: 80.0000 mg | CHEWABLE_TABLET | Freq: Four times a day (QID) | ORAL | Status: DC | PRN

## 2020-04-21 MED ORDER — OXYCODONE-ACETAMINOPHEN 5-325 MG PO TABS
1.0000 | ORAL_TABLET | ORAL | Status: DC | PRN
  Administered 2020-04-22 (×3): 2 via ORAL
  Administered 2020-04-23: 1 via ORAL
  Filled 2020-04-21 (×3): qty 2
  Filled 2020-04-21: qty 1
  Filled 2020-04-21: qty 2

## 2020-04-21 MED ORDER — KETOROLAC TROMETHAMINE 30 MG/ML IJ SOLN
30.0000 mg | Freq: Four times a day (QID) | INTRAMUSCULAR | Status: AC
  Administered 2020-04-21 – 2020-04-22 (×4): 30 mg via INTRAVENOUS
  Filled 2020-04-21 (×4): qty 1

## 2020-04-21 MED ORDER — KCL IN DEXTROSE-NACL 40-5-0.45 MEQ/L-%-% IV SOLN
INTRAVENOUS | Status: DC
  Filled 2020-04-21 (×2): qty 1000

## 2020-04-21 MED ORDER — AMISULPRIDE (ANTIEMETIC) 5 MG/2ML IV SOLN: 10.0000 mg | Freq: Once | INTRAVENOUS | Status: DC | PRN

## 2020-04-21 MED ORDER — BUPIVACAINE HCL (PF) 0.5 % IJ SOLN
INTRAMUSCULAR | Status: AC
  Filled 2020-04-21: qty 30

## 2020-04-21 MED ORDER — POLYETHYLENE GLYCOL 3350 17 G PO PACK: 17.0000 g | PACK | Freq: Every day | ORAL | Status: DC | PRN

## 2020-04-21 MED ORDER — ROCURONIUM BROMIDE 100 MG/10ML IV SOLN
INTRAVENOUS | Status: DC | PRN
  Administered 2020-04-21: 50 mg via INTRAVENOUS
  Administered 2020-04-21: 10 mg via INTRAVENOUS

## 2020-04-21 MED ORDER — DEXAMETHASONE SODIUM PHOSPHATE 4 MG/ML IJ SOLN
INTRAMUSCULAR | Status: DC | PRN
  Administered 2020-04-21: 10 mg via INTRAVENOUS

## 2020-04-21 MED ORDER — MENTHOL 3 MG MT LOZG: 1.0000 | LOZENGE | OROMUCOSAL | Status: DC | PRN

## 2020-04-21 MED ORDER — PROPOFOL 10 MG/ML IV BOLUS
INTRAVENOUS | Status: DC | PRN
  Administered 2020-04-21: 150 mg via INTRAVENOUS

## 2020-04-21 MED ORDER — ONDANSETRON HCL 4 MG PO TABS: 4.0000 mg | ORAL_TABLET | Freq: Four times a day (QID) | ORAL | Status: DC | PRN

## 2020-04-21 MED ORDER — METHYLENE BLUE 0.5 % INJ SOLN
INTRAVENOUS | Status: AC
  Filled 2020-04-21: qty 10

## 2020-04-21 MED ORDER — MIDAZOLAM HCL 5 MG/5ML IJ SOLN
INTRAMUSCULAR | Status: DC | PRN
  Administered 2020-04-21: 2 mg via INTRAVENOUS

## 2020-04-21 MED ORDER — KETAMINE HCL 10 MG/ML IJ SOLN
INTRAMUSCULAR | Status: DC | PRN
Start: 2020-04-21 — End: 2020-04-21
  Administered 2020-04-21: 30 mg via INTRAVENOUS

## 2020-04-21 MED ORDER — ONDANSETRON HCL 4 MG/2ML IJ SOLN
INTRAMUSCULAR | Status: DC | PRN
  Administered 2020-04-21: 4 mg via INTRAVENOUS

## 2020-04-21 MED ORDER — CLONIDINE HCL (ANALGESIA) 100 MCG/ML EP SOLN
EPIDURAL | Status: DC | PRN
  Administered 2020-04-21 (×2): 50 ug

## 2020-04-21 MED ORDER — MIDAZOLAM HCL 2 MG/2ML IJ SOLN
INTRAMUSCULAR | Status: AC
  Filled 2020-04-21: qty 2

## 2020-04-21 MED ORDER — DOCUSATE SODIUM 100 MG PO CAPS
100.0000 mg | ORAL_CAPSULE | Freq: Two times a day (BID) | ORAL | Status: DC
  Administered 2020-04-22 – 2020-04-23 (×3): 100 mg via ORAL
  Filled 2020-04-21 (×4): qty 1

## 2020-04-21 MED ORDER — FENTANYL CITRATE (PF) 100 MCG/2ML IJ SOLN
INTRAMUSCULAR | Status: AC
  Filled 2020-04-21: qty 2

## 2020-04-21 MED ORDER — SUGAMMADEX SODIUM 200 MG/2ML IV SOLN
INTRAVENOUS | Status: DC | PRN
Start: 2020-04-21 — End: 2020-04-21
  Administered 2020-04-21: 200 mg via INTRAVENOUS

## 2020-04-21 MED ORDER — ENOXAPARIN SODIUM 40 MG/0.4ML ~~LOC~~ SOLN
40.0000 mg | SUBCUTANEOUS | Status: DC
  Administered 2020-04-22: 40 mg via SUBCUTANEOUS
  Filled 2020-04-21 (×2): qty 0.4

## 2020-04-21 MED ORDER — KETAMINE HCL 50 MG/5ML IJ SOSY
PREFILLED_SYRINGE | INTRAMUSCULAR | Status: AC
  Filled 2020-04-21: qty 5

## 2020-04-21 MED ORDER — HYDROMORPHONE HCL 1 MG/ML IJ SOLN
0.2000 mg | INTRAMUSCULAR | Status: DC | PRN
  Administered 2020-04-21: 0.5 mg via INTRAVENOUS
  Filled 2020-04-21: qty 1

## 2020-04-21 MED ORDER — BUPIVACAINE HCL (PF) 0.5 % IJ SOLN
INTRAMUSCULAR | Status: DC | PRN
  Administered 2020-04-21: 30 mL

## 2020-04-21 MED ORDER — BISACODYL 10 MG RE SUPP: 10.0000 mg | Freq: Every day | RECTAL | Status: DC | PRN

## 2020-04-21 MED ORDER — PANTOPRAZOLE SODIUM 40 MG PO TBEC
40.0000 mg | DELAYED_RELEASE_TABLET | Freq: Every day | ORAL | Status: DC
  Administered 2020-04-22 – 2020-04-23 (×2): 40 mg via ORAL
  Filled 2020-04-21 (×2): qty 1

## 2020-04-21 MED ORDER — LIDOCAINE HCL (CARDIAC) PF 100 MG/5ML IV SOSY
PREFILLED_SYRINGE | INTRAVENOUS | Status: DC | PRN
  Administered 2020-04-21: 20 mg via INTRAVENOUS

## 2020-04-21 SURGICAL SUPPLY — 54 items
BARRIER ADHS 3X4 INTERCEED (GAUZE/BANDAGES/DRESSINGS) IMPLANT
BENZOIN TINCTURE PRP APPL 2/3 (GAUZE/BANDAGES/DRESSINGS) ×4 IMPLANT
CANISTER SUCT 3000ML PPV (MISCELLANEOUS) ×4 IMPLANT
CLIP VESOCCLUDE SM NARROW 6/CT (CLIP) ×4 IMPLANT
CLOSURE WOUND 1/2 X4 (GAUZE/BANDAGES/DRESSINGS) ×1
COVER WAND RF STERILE (DRAPES) ×4 IMPLANT
DECANTER SPIKE VIAL GLASS SM (MISCELLANEOUS) IMPLANT
DRAIN PENROSE 1/4X12 LTX (DRAIN) IMPLANT
DRAPE CESAREAN BIRTH W POUCH (DRAPES) ×1 IMPLANT
DRAPE WARM FLUID 44X44 (DRAPES) IMPLANT
DRSG OPSITE POSTOP 4X10 (GAUZE/BANDAGES/DRESSINGS) ×4 IMPLANT
DRSG PAD ABDOMINAL 8X10 ST (GAUZE/BANDAGES/DRESSINGS) ×4 IMPLANT
DURAPREP 26ML APPLICATOR (WOUND CARE) ×4 IMPLANT
GAUZE 4X4 16PLY RFD (DISPOSABLE) ×4 IMPLANT
GAUZE SPONGE 4X4 12PLY STRL (GAUZE/BANDAGES/DRESSINGS) ×4 IMPLANT
GLOVE BIO SURGEON STRL SZ7 (GLOVE) ×4 IMPLANT
GLOVE BIOGEL PI IND STRL 7.0 (GLOVE) ×6 IMPLANT
GLOVE BIOGEL PI INDICATOR 7.0 (GLOVE) ×6
GOWN STRL REUS W/ TWL LRG LVL3 (GOWN DISPOSABLE) ×4 IMPLANT
GOWN STRL REUS W/ TWL XL LVL3 (GOWN DISPOSABLE) ×2 IMPLANT
GOWN STRL REUS W/TWL LRG LVL3 (GOWN DISPOSABLE) ×4
GOWN STRL REUS W/TWL XL LVL3 (GOWN DISPOSABLE) ×2
HIBICLENS CHG 4% 4OZ BTL (MISCELLANEOUS) ×4 IMPLANT
KIT TURNOVER KIT B (KITS) ×4 IMPLANT
LIGASURE IMPACT 36 18CM CVD LR (INSTRUMENTS) ×3 IMPLANT
NEEDLE HYPO 22GX1.5 SAFETY (NEEDLE) ×8 IMPLANT
NS IRRIG 1000ML POUR BTL (IV SOLUTION) ×4 IMPLANT
PACK ABDOMINAL GYN (CUSTOM PROCEDURE TRAY) ×4 IMPLANT
PAD ARMBOARD 7.5X6 YLW CONV (MISCELLANEOUS) ×8 IMPLANT
PAD OB MATERNITY 4.3X12.25 (PERSONAL CARE ITEMS) ×4 IMPLANT
PENCIL SMOKE EVAC W/HOLSTER (ELECTROSURGICAL) ×4 IMPLANT
PENCIL SMOKE EVACUATOR (MISCELLANEOUS) ×4 IMPLANT
RTRCTR C-SECT PINK 25CM LRG (MISCELLANEOUS) IMPLANT
SPECIMEN JAR MEDIUM (MISCELLANEOUS) ×4 IMPLANT
SPONGE LAP 18X18 RF (DISPOSABLE) ×14 IMPLANT
STRIP CLOSURE SKIN 1/2X4 (GAUZE/BANDAGES/DRESSINGS) ×3 IMPLANT
SUT VIC AB 0 CT1 18XCR BRD8 (SUTURE) ×6 IMPLANT
SUT VIC AB 0 CT1 27 (SUTURE) ×12
SUT VIC AB 0 CT1 27XBRD ANBCTR (SUTURE) ×12 IMPLANT
SUT VIC AB 0 CT1 36 (SUTURE) ×4 IMPLANT
SUT VIC AB 0 CT1 8-18 (SUTURE) ×6
SUT VIC AB 3-0 CT1 27 (SUTURE) ×2
SUT VIC AB 3-0 CT1 TAPERPNT 27 (SUTURE) ×2 IMPLANT
SUT VIC AB 3-0 SH 27 (SUTURE)
SUT VIC AB 3-0 SH 27X BRD (SUTURE) IMPLANT
SUT VIC AB 4-0 KS 27 (SUTURE) ×4 IMPLANT
SUT VICRYL 0 TIES 12 18 (SUTURE) ×4 IMPLANT
SWAB COLLECTION DEVICE MRSA (MISCELLANEOUS) ×3 IMPLANT
SWAB CULTURE ESWAB REG 1ML (MISCELLANEOUS) ×3 IMPLANT
SYR BULB IRRIG 60ML STRL (SYRINGE) ×3 IMPLANT
SYR CONTROL 10ML LL (SYRINGE) ×8 IMPLANT
TOWEL GREEN STERILE FF (TOWEL DISPOSABLE) ×8 IMPLANT
TRAY FOLEY W/BAG SLVR 14FR (SET/KITS/TRAYS/PACK) ×4 IMPLANT
TRAY FOLEY W/BAG SLVR 14FR LF (SET/KITS/TRAYS/PACK) ×3 IMPLANT

## 2020-04-21 NOTE — Anesthesia Preprocedure Evaluation (Addendum)
Anesthesia Evaluation  Patient identified by MRN, date of birth, ID band Patient awake    Reviewed: Allergy & Precautions, NPO status , Patient's Chart, lab work & pertinent test results  Airway Mallampati: II  TM Distance: >3 FB Neck ROM: Full    Dental  (+) Dental Advisory Given, Teeth Intact   Pulmonary former smoker,    breath sounds clear to auscultation       Cardiovascular negative cardio ROS   Rhythm:Regular Rate:Normal     Neuro/Psych negative neurological ROS     GI/Hepatic negative GI ROS, Neg liver ROS,   Endo/Other  negative endocrine ROS  Renal/GU negative Renal ROS     Musculoskeletal   Abdominal   Peds  Hematology  (+) anemia ,   Anesthesia Other Findings   Reproductive/Obstetrics Fibroid uterus                            Lab Results  Component Value Date   WBC 18.0 (H) 04/21/2020   HGB 7.4 (L) 04/21/2020   HCT 23.3 (L) 04/21/2020   MCV 74.2 (L) 04/21/2020   PLT 409 (H) 04/21/2020   Lab Results  Component Value Date   CREATININE 0.77 04/21/2020   BUN <5 (L) 04/21/2020   NA 136 04/21/2020   K 3.1 (L) 04/21/2020   CL 107 04/21/2020   CO2 20 (L) 04/21/2020    Anesthesia Physical Anesthesia Plan  ASA: III  Anesthesia Plan: General   Post-op Pain Management:  Regional for Post-op pain   Induction: Intravenous  PONV Risk Score and Plan: 4 or greater and Dexamethasone, Ondansetron, Treatment may vary due to age or medical condition, Scopolamine patch - Pre-op and Midazolam  Airway Management Planned: Oral ETT  Additional Equipment: None  Intra-op Plan:   Post-operative Plan: Extubation in OR  Informed Consent: I have reviewed the patients History and Physical, chart, labs and discussed the procedure including the risks, benefits and alternatives for the proposed anesthesia with the patient or authorized representative who has indicated his/her  understanding and acceptance.     Dental advisory given  Plan Discussed with: CRNA  Anesthesia Plan Comments:         Anesthesia Quick Evaluation

## 2020-04-21 NOTE — Transfer of Care (Signed)
Immediate Anesthesia Transfer of Care Note  Patient: Sandra Gregory  Procedure(s) Performed: HYSTERECTOMY ABDOMINAL (N/A Abdomen)  Patient Location: PACU  Anesthesia Type:General  Level of Consciousness: awake, oriented and patient cooperative  Airway & Oxygen Therapy: Patient Spontanous Breathing and Patient connected to nasal cannula oxygen  Post-op Assessment: Report given to RN and Post -op Vital signs reviewed and stable  Post vital signs: Reviewed  Last Vitals:  Vitals Value Taken Time  BP 122/72 04/21/20 1500  Temp    Pulse 94 04/21/20 1503  Resp 17 04/21/20 1503  SpO2 100 % 04/21/20 1503  Vitals shown include unvalidated device data.  Last Pain:  Vitals:   04/21/20 1500  TempSrc:   PainSc: (P) Asleep         Complications: No complications documented.

## 2020-04-21 NOTE — Anesthesia Procedure Notes (Signed)
Procedure Name: Intubation Date/Time: 04/21/2020 12:31 PM Performed by: Jenne Campus, CRNA Pre-anesthesia Checklist: Patient identified, Emergency Drugs available, Suction available and Patient being monitored Patient Re-evaluated:Patient Re-evaluated prior to induction Oxygen Delivery Method: Circle System Utilized Preoxygenation: Pre-oxygenation with 100% oxygen Induction Type: IV induction Ventilation: Mask ventilation without difficulty Laryngoscope Size: Mac and 3 Grade View: Grade I Tube type: Oral Tube size: 7.0 mm Number of attempts: 1 Airway Equipment and Method: Stylet and Oral airway Placement Confirmation: ETT inserted through vocal cords under direct vision,  positive ETCO2 and breath sounds checked- equal and bilateral Secured at: 22 cm Tube secured with: Tape Dental Injury: Teeth and Oropharynx as per pre-operative assessment  Comments: AOI by Pierce Crane., SRNA

## 2020-04-21 NOTE — Anesthesia Procedure Notes (Deleted)
Performed by: Kemoni Ortega K, CRNA       

## 2020-04-21 NOTE — Progress Notes (Signed)
[place] [facility] Infect[facility]  Date of Admission:  04/17/2020     Total days of antibiotics 5         ASSESSMENT:  Sandra Gregory is scheduled for total hysterectomy with Dr. Ihor Dow with concern for an infected uterus as myomectomy would possibly leave infection and require repeat surgery. Recommend continued antibiotics for now as hysterectomy will likely obtain source control of the infection. Duration of further antibiotics to be determined pending surgical results. Continue current dose of ceftriaxone and metronidazole for now.    PLAN:  1. Continue ceftriaxone and metronidazole.  2. Surgery with Dr. Ihor Dow today.   Principal Problem:   Fever Active Problems:   Fibroids   Tachycardia   Iron deficiency anemia due to chronic blood loss   Anemia   . sodium chloride   Intravenous Once  . feeding supplement  1 Container Oral BID BM  . ferrous sulfate  325 mg Oral Q breakfast  . multivitamin with minerals  1 tablet Oral Daily  . povidone-iodine  2 application Topical Once    SUBJECTIVE:  Afebrile overnight with no acute events.Wife and mother-in-law at bedside during visit. Denies fevers or chills.   No Known Allergies   Review of Systems: Review of Systems  Constitutional: Negative for chills, fever and weight loss.  Respiratory: Negative for cough, shortness of breath and wheezing.   Cardiovascular: Negative for chest pain and leg swelling.  Gastrointestinal: Negative for abdominal pain, constipation, diarrhea, nausea and vomiting.  Skin: Negative for rash.      OBJECTIVE: Vitals:   04/20/20 2055 04/21/20 0525 04/21/20 0802 04/21/20 0817  BP: 112/81 115/83 121/72 112/73  Pulse: 83 80 94 92  Resp: 18 18 18 18   Temp: 97.7 F (36.5 C) 98.6 F (37 C) 99 F (37.2 C) 99.3 F (37.4 C)  TempSrc: Oral  Oral   SpO2: 99% 100% 98% 98%  Weight:      Height:       Body mass index is 28.24 kg/m.  Physical Exam Constitutional:       General: She is not in acute distress.    Appearance: She is well-developed.  Cardiovascular:     Rate and Rhythm: Normal rate and regular rhythm.     Heart sounds: Normal heart sounds.  Pulmonary:     Effort: Pulmonary effort is normal.     Breath sounds: Normal breath sounds.  Skin:    General: Skin is warm and dry.  Neurological:     Mental Status: She is alert and oriented to person, place, and time.  Psychiatric:        Behavior: Behavior normal.        Thought Content: Thought content normal.        Judgment: Judgment normal.     Lab Results Lab Results  Component Value Date   WBC 18.0 (H) 04/21/2020   HGB 7.4 (L) 04/21/2020   HCT 23.3 (L) 04/21/2020   MCV 74.2 (L) 04/21/2020   PLT 409 (H) 04/21/2020    Lab Results  Component Value Date   CREATININE 0.77 04/21/2020   BUN <5 (L) 04/21/2020   NA 136 04/21/2020   K 3.1 (L) 04/21/2020   CL 107 04/21/2020   CO2 20 (L) 04/21/2020    Lab Results  Component Value Date   ALT 12 04/20/2020   AST 15 04/20/2020   ALKPHOS 84 04/20/2020   BILITOT 0.8 04/20/2020     Microbiology: Recent Results (from the  past 240 hour(s))  Blood culture (routine single)     Status: None (Preliminary result)   Collection Time: 04/17/20  5:00 PM   Specimen: BLOOD  Result Value Ref Range Status   Specimen Description BLOOD SITE NOT SPECIFIED  Final   Special Requests   Final    BOTTLES DRAWN AEROBIC AND ANAEROBIC Blood Culture adequate volume   Culture   Final    NO GROWTH 3 DAYS Performed at [facility], [address], [place], [place] [address]    Report Status PENDING  Incomplete  Urine culture     Status: Abnormal   Collection Time: 04/17/20  6:58 PM   Specimen: In/Out Cath Urine  Result Value Ref Range Status   Specimen Description IN/OUT CATH URINE  Final   Special Requests   Final    NONE Performed at [facility], [place] [address] [place], [place] [address]    Culture MULTIPLE SPECIES PRESENT, SUGGEST  RECOLLECTION (A)  Final   Report Status 04/19/2020 FINAL  Final  Wet prep, genital     Status: Abnormal   Collection Time: 04/17/20  7:13 PM  Result Value Ref Range Status   Yeast Wet Prep HPF POC NONE SEEN NONE SEEN Final   Trich, Wet Prep NONE SEEN NONE SEEN Final   Clue Cells Wet Prep HPF POC NONE SEEN NONE SEEN Final   WBC, Wet Prep HPF POC FEW (A) NONE SEEN Final   Sperm NONE SEEN  Final    Comment: Performed at [facility], [place] [address] [place], [place] [address]  SARS Coronavirus 2 by RT PCR (hospital order, performed in [facility] hospital lab) Nasopharyngeal Nasopharyngeal Swab     Status: None   Collection Time: 04/17/20  7:55 PM   Specimen: Nasopharyngeal Swab  Result Value Ref Range Status   SARS Coronavirus 2 NEGATIVE NEGATIVE Final    Comment: (NOTE) SARS-CoV-2 target nucleic acids are NOT DETECTED.  The SARS-CoV-2 RNA is generally detectable in upper and lower respiratory specimens during the acute phase of infection. The lowest concentration of SARS-CoV-2 viral copies this assay can detect is 250 copies / mL. A negative result does not preclude SARS-CoV-2 infection and should not be used as the sole basis for treatment or other patient management decisions.  A negative result may occur with improper specimen collection / handling, submission of specimen other than nasopharyngeal swab, presence of viral mutation(s) within the areas targeted by this assay, and inadequate number of viral copies (<250 copies / mL). A negative result must be combined with clinical observations, patient history, and epidemiological information.  Fact Sheet for Patients:   StrictlyIdeas.no  Fact Sheet for Healthcare Providers: BankingDealers.co.za  This test is not yet approved or  cleared by the [place] FDA and has been authorized for detection and/or diagnosis of SARS-CoV-2 by FDA under an Emergency Use Authorization  (EUA).  This EUA will remain in effect (meaning this test can be used) for the duration of the COVID-19 declaration under Section 564(b)(1) of the Act, 21 U.S.C. section 360bbb-3(b)(1), unless the authorization is terminated or revoked sooner.  Performed at [facility], [place] [address], [place], [place] [address]   Culture, blood (single)     Status: None (Preliminary result)   Collection Time: 04/18/20  2:38 AM   Specimen: BLOOD  Result Value Ref Range Status   Specimen Description BLOOD RIGHT FOREARM  Final   Special Requests   Final  BOTTLES DRAWN AEROBIC AND ANAEROBIC Blood Culture adequate volume   Culture   Final    NO GROWTH 2 DAYS Performed at [facility], [place] [address], [place], [place] [address]    Report Status PENDING  Incomplete  Urine Culture     Status: None   Collection Time: 04/19/20 11:05 AM   Specimen: Urine, Random  Result Value Ref Range Status   Specimen Description URINE, RANDOM  Final   Special Requests NONE  Final   Culture   Final    NO GROWTH Performed at [facility], [place] [address], [place], [place] [address]    Report Status 04/20/2020 FINAL  Final  C Difficile Quick Screen w PCR reflex     Status: None   Collection Time: 04/19/20  2:37 PM   Specimen: STOOL  Result Value Ref Range Status   C Diff antigen NEGATIVE NEGATIVE Final   C Diff toxin NEGATIVE NEGATIVE Final   C Diff interpretation No C. difficile detected.  Final    Comment: Performed at [facility], [place] [address] [place], [place] [address]  Surgical pcr screen     Status: None   Collection Time: 04/20/20  3:23 AM   Specimen: Nasal Mucosa; Nasal Swab  Result Value Ref Range Status   MRSA, PCR NEGATIVE NEGATIVE Final   Staphylococcus aureus NEGATIVE NEGATIVE Final    Comment: (NOTE) The Xpert SA Assay (FDA approved for NASAL specimens in patients 43 years of age and older), is one component of a comprehensive surveillance program. It is not intended  to diagnose infection nor to guide or monitor treatment. Performed at [facility], [place] [address], [place], [place] [address]      Terri Piedra, [place] [facility] [place] [facility]  04/21/2020  10:52 AM

## 2020-04-21 NOTE — Progress Notes (Signed)
PROGRESS NOTE    Sandra Gregory  WVP:[id number] DOB: 09-07-1986 DOA: 04/17/2020 PCP: Default, Provider, MD   Brief Narrative: 34 year old with past medical history of miscarriage may  25, since then she cannot stand for long time because of dizziness.  She report lightheadedness.  She has been feeling weak, she report weight loss.  She report shortness of breath with activity.  She presented today for preop testing for myomectomy.  Patient was found to be tachycardic heart rate up to 140, temperature 102.  She was sent to the ER.  She met sepsis criteria.  White blood cell count of 21 and hemoglobin of 6.7.  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  She has several large fibroids with up to 12 cm long.  Patient had evaluation for sepsis, blood cultures negative, urine culture negative, she was checked for C. difficile which was negative.  CT abdomen and pelvis showed enlarged myomatous uterus, dominant fibroid with lower uterine segment 10.2 cm containing multiple foci of air super imposed not excluded.  Chest x-ray negative for pneumonia.  Her infection is thought to be secondary to necrotic uterus fibroid.  Plan is for hysterectomy today.    Assessment & Plan:   Principal Problem:   Fever Active Problems:   Fibroids   Tachycardia   Iron deficiency anemia due to chronic blood loss   Anemia  1-Symptomatic anemia due to iron deficiency likely from menorrhagia: Received 2 units of packed red blood cells 7/31. Iron 26, ferritin 170 She will need IV iron when source of infection is controlled.  Bilirubin normal.  Hb stable 8.7--8.5--7.4. might required Blood transfusion during sx.  Continue with oral ferrous sulfate.   2-Sepsis: Patient presented with fever tachycardia leukocytosis. Suspect related to uterine fibroid, necrotics.  Covid 19 Negative.  Chest X-ray: Negative UA; moderate leukocytes, white blood cells 6-10. Pelvis ultrasound: Enlarged myomatous uterus, unremarkable right  ovary, nonvisualization of the endometrium and left ovary. Urine culture: multiple morphology, resend urine culture: no growth to date.  Blood culture; No growth to date.  Ct abdomen pelvis: Markedly enlarged fibroid uterus measuring 22 cm, dominant fibroid with lower uterine segment 10.2 cm containing multiple foci of air superimposed infection not excluded.  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  C diff negative.  ID following.  Antibiotics change to ceftriaxone and flagyl by ID on 8/01.   WBC down to 18 from 28.  For hysterectomy today.  3-Hypokalemia: replete orally.   4-Fibroid menorrhagia: Per OB Abdominal pain thought to be related to fibroid.  Lipase normal.   5-Tachycardia; form Dehydration, anemia, fever.  IV fluids, tylenol.  6--Diarrhea; C diff negative. CT scan non revealing. GI pathogen ordered.   Estimated body mass index is 28.24 kg/m as calculated from the following:   Height as of this encounter: 5' 7"  (1.702 m).   Weight as of this encounter: 81.8 kg.   DVT prophylaxis: SCD Code Status: SCDs Family Communication: Wife at bedside Disposition Plan:  Status is: Inpatient  Dispo: The patient is from: Home              Anticipated d/c is to: Home              Anticipated d/c date is: 2 days              Patient currently is not medically stable to d/c.        Consultants:   None  Procedures:   None  Antimicrobials:    Subjective: She is feeling well, denies chest pain shortness of breath. She is ready for surgery. Objective: Vitals:   04/20/20 0935 04/20/20 1800 04/20/20 2055 04/21/20 0525  BP: 114/78 111/74 112/81 115/83  Pulse: 98 90 83 80  Resp: 18 20 18 18   Temp: 99.5 F (37.5 C) 99.8 F (37.7 C) 97.7 F (36.5 C) 98.6 F (37 C)  TempSrc: Oral Oral Oral   SpO2: 100% 99% 99% 100%  Weight:      Height:        Intake/Output Summary (Last 24 hours) at 04/21/2020 0735 Last  data filed at 04/21/2020 0525 Gross per 24 hour  Intake 2260.64 ml  Output 0 ml  Net 2260.64 ml   Filed Weights   04/17/20 1508 04/18/20 2055  Weight: 76.7 kg 81.8 kg    Examination:  General exam: NAD Respiratory system: CTA Cardiovascular system: S 1, S 2 RRR Gastrointestinal system: BS present, soft,nt Central nervous system: Alert Extremities: No edema  Data Reviewed: I have personally reviewed following labs and imaging studies  CBC: Recent Labs  Lab 04/17/20 1320 04/17/20 1320 04/17/20 1645 04/17/20 1645 04/18/20 0857 04/18/20 2018 04/19/20 0424 04/19/20 0932 04/20/20 0912  WBC 23.4*  --  21.8*  --  18.7*  --   --  28.8* 26.7*  NEUTROABS  --   --  18.9*  --   --   --   --   --   --   HGB 7.0*   < > 6.7*   < > 7.3* 9.7* 7.8* 8.7* 8.5*  HCT 22.5*   < > 22.0*   < > 22.8* 29.7* 23.6* 26.5* 27.1*  MCV 73.8*  --  75.1*  --  72.8*  --   --  73.8* 76.6*  PLT 565*  --  499*  --  468*  --   --  475* 472*   < > = values in this interval not displayed.   Basic Metabolic Panel: Recent Labs  Lab 04/17/20 1321 04/17/20 1645 04/18/20 0857 04/19/20 0932 04/20/20 0912  NA 133* 130* 136 136 138  K 3.0* 3.1* 3.3* 3.3* 3.1*  CL 97* 95* 104 103 107  CO2 23 21* 24 21* 19*  GLUCOSE 115* 97 109* 126* 96  BUN 5* 5* <5* <5* <5*  CREATININE 0.95 0.94 0.78 0.93 0.82  CALCIUM 9.0 8.9 8.7* 8.8* 8.5*  MG  --   --  1.8  --   --    GFR: Estimated Creatinine Clearance: 108.4 mL/min (by C-G formula based on SCr of 0.82 mg/dL). Liver Function Tests: Recent Labs  Lab 04/17/20 1645 04/18/20 2018 04/20/20 0912  AST 20 21 15   ALT 15 13 12   ALKPHOS 77 82 84  BILITOT 0.8 1.1 0.8  PROT 8.0 8.3* 7.3  ALBUMIN 2.7* 2.6* 2.2*   Recent Labs  Lab 04/18/20 2018  LIPASE 18   No results for input(s): AMMONIA in the last 168 hours. Coagulation Profile: Recent Labs  Lab 04/17/20 1645  INR 1.2   Cardiac Enzymes: No results for input(s): CKTOTAL, CKMB, CKMBINDEX, TROPONINI in the  last 168 hours. BNP (last 3 results) No results for input(s): PROBNP in the last 8760 hours. HbA1C: No results for input(s): HGBA1C in the last 72 hours. CBG: No results for input(s): GLUCAP in the last 168 hours. Lipid Profile: No results for input(s): CHOL, HDL, LDLCALC, TRIG, CHOLHDL, LDLDIRECT in the last 72 hours. Thyroid Function Tests: No results for input(s): TSH,  T4TOTAL, FREET4, T3FREE, THYROIDAB in the last 72 hours. Anemia Panel: No results for input(s): VITAMINB12, FOLATE, FERRITIN, TIBC, IRON, RETICCTPCT in the last 72 hours. Sepsis Labs: Recent Labs  Lab 04/17/20 1618 04/17/20 1958 04/19/20 1116  LATICACIDVEN 1.4 1.1 1.1    Recent Results (from the past 240 hour(s))  Blood culture (routine single)     Status: None (Preliminary result)   Collection Time: 04/17/20  5:00 PM   Specimen: BLOOD  Result Value Ref Range Status   Specimen Description BLOOD SITE NOT SPECIFIED  Final   Special Requests   Final    BOTTLES DRAWN AEROBIC AND ANAEROBIC Blood Culture adequate volume   Culture   Final    NO GROWTH 3 DAYS Performed at [facility], [place] [address], [place], [place] [address]    Report Status PENDING  Incomplete  Urine culture     Status: Abnormal   Collection Time: 04/17/20  6:58 PM   Specimen: In/Out Cath Urine  Result Value Ref Range Status   Specimen Description IN/OUT CATH URINE  Final   Special Requests   Final    NONE Performed at [facility], [place] [address], [place], [place] [address]    Culture MULTIPLE SPECIES PRESENT, SUGGEST RECOLLECTION (A)  Final   Report Status 04/19/2020 FINAL  Final  Wet prep, genital     Status: Abnormal   Collection Time: 04/17/20  7:13 PM  Result Value Ref Range Status   Yeast Wet Prep HPF POC NONE SEEN NONE SEEN Final   Trich, Wet Prep NONE SEEN NONE SEEN Final   Clue Cells Wet Prep HPF POC NONE SEEN NONE SEEN Final   WBC, Wet Prep HPF POC FEW (A) NONE SEEN Final   Sperm NONE SEEN  Final    Comment:  Performed at [facility], [place] [address], [place], [place] [address]  SARS Coronavirus 2 by RT PCR (hospital order, performed in [facility] hospital lab) Nasopharyngeal Nasopharyngeal Swab     Status: None   Collection Time: 04/17/20  7:55 PM   Specimen: Nasopharyngeal Swab  Result Value Ref Range Status   SARS Coronavirus 2 NEGATIVE NEGATIVE Final    Comment: (NOTE) SARS-CoV-2 target nucleic acids are NOT DETECTED.  The SARS-CoV-2 RNA is generally detectable in upper and lower respiratory specimens during the acute phase of infection. The lowest concentration of SARS-CoV-2 viral copies this assay can detect is 250 copies / mL. A negative result does not preclude SARS-CoV-2 infection and should not be used as the sole basis for treatment or other patient management decisions.  A negative result may occur with improper specimen collection / handling, submission of specimen other than nasopharyngeal swab, presence of viral mutation(s) within the areas targeted by this assay, and inadequate number of viral copies (<250 copies / mL). A negative result must be combined with clinical observations, patient history, and epidemiological information.  Fact Sheet for Patients:   StrictlyIdeas.no  Fact Sheet for Healthcare Providers: BankingDealers.co.za  This test is not yet approved or  cleared by the [place] FDA and has been authorized for detection and/or diagnosis of SARS-CoV-2 by FDA under an Emergency Use Authorization (EUA).  This EUA will remain in effect (meaning this test can be used) for the duration of the COVID-19 declaration under Section 564(b)(1) of the Act, 21 U.S.C. section 360bbb-3(b)(1), unless the authorization is terminated or revoked sooner.  Performed at [facility], [place] [address] [place], [place]  [address]   Culture, blood (single)     Status: None (Preliminary result)   Collection Time: 04/18/20   2:38 AM   Specimen: BLOOD  Result Value Ref Range Status   Specimen Description BLOOD RIGHT FOREARM  Final   Special Requests   Final    BOTTLES DRAWN AEROBIC AND ANAEROBIC Blood Culture adequate volume   Culture   Final    NO GROWTH 2 DAYS Performed at [facility], [address] [place], [place] [address]    Report Status PENDING  Incomplete  Urine Culture     Status: None   Collection Time: 04/19/20 11:05 AM   Specimen: Urine, Random  Result Value Ref Range Status   Specimen Description URINE, RANDOM  Final   Special Requests NONE  Final   Culture   Final    NO GROWTH Performed at [facility], [place] [address] [place], [place] [address]    Report Status 04/20/2020 FINAL  Final  C Difficile Quick Screen w PCR reflex     Status: None   Collection Time: 04/19/20  2:37 PM   Specimen: STOOL  Result Value Ref Range Status   C Diff antigen NEGATIVE NEGATIVE Final   C Diff toxin NEGATIVE NEGATIVE Final   C Diff interpretation No C. difficile detected.  Final    Comment: Performed at [facility], [place] [address], [place], [place] [address]  Surgical pcr screen     Status: None   Collection Time: 04/20/20  3:23 AM   Specimen: Nasal Mucosa; Nasal Swab  Result Value Ref Range Status   MRSA, PCR NEGATIVE NEGATIVE Final   Staphylococcus aureus NEGATIVE NEGATIVE Final    Comment: (NOTE) The Xpert SA Assay (FDA approved for NASAL specimens in patients 33 years of age and older), is one component of a comprehensive surveillance program. It is not intended to diagnose infection nor to guide or monitor treatment. Performed at [facility], [place] [address], [place],  [address]          Radiology Studies: CT ABDOMEN PELVIS W CONTRAST  Result Date: 04/19/2020 CLINICAL DATA:  Abdominal pain and fever. EXAM: CT ABDOMEN AND PELVIS WITH CONTRAST TECHNIQUE: Multidetector CT imaging of the abdomen and pelvis was performed using the standard protocol  following bolus administration of intravenous contrast. CONTRAST:  159m OMNIPAQUE IOHEXOL 300 MG/ML  SOLN COMPARISON:  Pelvic ultrasound-04/17/2020; pelvic MRI-12/23/2019 FINDINGS: Lower chest: Limited visualization of the lower thorax demonstrates minimal dependent subpleural ground-glass atelectasis. No discrete focal airspace opacities. Normal heart size.  No pericardial effusion. Hepatobiliary: Normal hepatic contour. No discrete hepatic lesions. Potential layering radiopaque debris within otherwise normal-appearing gallbladder as could be seen in the setting of gallbladder sludge (image 38, series 3). No definitive gallbladder wall thickening or pericholecystic fluid. No radiopaque gallstones. No intra extrahepatic bili duct dilatation. No ascites. Pancreas: Normal appearance of the pancreas. Spleen: Normal appearance of the spleen. Adrenals/Urinary Tract: Moderate bilateral grossly symmetric ureterectasis and pelvicaliectasis secondary to mass effect of the markedly enlarged myomatous uterus on the bilateral ureters, new compared to abdominal/pelvic MRI performed 01/22/2020. No renal stones this postcontrast examination. No discrete renal lesions. No perinephric stranding. Normal appearance of the bilateral adrenal glands. There is mass effect of the fibroid uterus upon an otherwise normal-appearing urinary bladder. Stomach/Bowel: There is mass effect on the sigmoid colon by the markedly enlarged myomatous uterus. Ingested enteric contrast extends to the level of the ascending colon. No evidence of enteric  obstruction. No discrete areas of bowel wall thickening. Normal appearance of the terminal ileum. The appendix is not visualized, however there is no pericecal inflammatory change. No pneumoperitoneum, pneumatosis or portal venous gas. Vascular/Lymphatic: Normal caliber the abdominal aorta. The major branch vessels of the abdominal aorta appear patent on this non CTA examination. No bulky retroperitoneal,  mesenteric, pelvic or inguinal lymphadenopathy. Reproductive: The uterus is markedly enlarged measuring approximately 22.2 x 16.3 x 9.4 cm (sagittal image 60, series 7; axial image 54, series 3) as it is replaced with multiple hypertrophied hypoenhancing uterine fibroids. There is a dominant fibroid within the lower uterine segment which measures approximately 10.2 x 10.0 x 9.5 cm (sagittal image 68, series 7; axial image 65, series 3) and contains several foci of intraluminal air as well as mixed attenuating debris, which given provided history of fever, superimposed infection is not excluded. Expected hypertrophy of the bilateral gonadal veins. No discrete adnexal lesions. Other: Minimal subcutaneous edema about the midline of the low back. Musculoskeletal: No acute or aggressive osseous abnormalities. IMPRESSION: 1. Markedly enlarged fibroid uterus measuring 22.2 cm in maximal diameter. 2. A dominant fibroid within the lower uterine segment measuring 10.2 cm contains multiple foci of air, nonspecific though given provided history of fever, superimposed infection is not excluded. 3. Moderate bilateral ureterectasis and pelvicaliectasis secondary to mass effect of the myomatous uterus on the bilateral ureters, new compared to abdominal/pelvic MRI performed 12/24/2019. 4. Potential gallbladder sludge within an otherwise normal-appearing gallbladder. Critical Value/emergent results were called by telephone at the time of interpretation on 04/19/2020 at 1:41 pm to provider [facility] , who verbally acknowledged these results. Electronically Signed   By: Sandi Mariscal M.D.   On: 04/19/2020 13:45        Scheduled Meds: . sodium chloride   Intravenous Once  . acetaminophen  1,000 mg Oral On Call to OR  . chlorhexidine  15 mL Mouth/Throat Once   Or  . mouth rinse  15 mL Mouth Rinse Once  . feeding supplement  1 Container Oral BID BM  . ferrous sulfate  325 mg Oral Q breakfast  . gabapentin  300 mg Oral On  Call to OR  . multivitamin with minerals  1 tablet Oral Daily  . povidone-iodine  2 application Topical Once  . sodium citrate-citric acid  30 mL Oral On Call to OR   Continuous Infusions: .  ceFAZolin (ANCEF) IV    . cefTRIAXone (ROCEPHIN)  IV 2 g (04/20/20 1715)  . lactated ringers    . lactated ringers 125 mL/hr at 04/21/20 0058  . metronidazole 500 mg (04/21/20 0200)     LOS: 3 days    Time spent: 35 minutes.     Elmarie Shiley, MD [facility]   If 7PM-7AM, please contact night-coverage [email or web address]  04/21/2020, 7:35 AM

## 2020-04-21 NOTE — Brief Op Note (Signed)
04/21/2020  3:01 PM  PATIENT:  Sandra Gregory  34 y.o. female  PRE-OPERATIVE DIAGNOSIS:  FIBROIDS, PYOMETRA  POST-OPERATIVE DIAGNOSIS:  fibroids, pyometra  PROCEDURE:  Procedure(s) with comments: HYSTERECTOMY ABDOMINAL (N/A) - possible abdominal hysterectomy possible bialteral salpingectomy; cystoscopy  SURGEON:  Surgeon(s) and Role:    * Lavonia Drafts, MD - Primary    * Anyanwu, Sallyanne Havers, MD - Assisting  ANESTHESIA:   general  EBL:  300 mL   BLOOD ADMINISTERED:none  DRAINS: none   LOCAL MEDICATIONS USED:  MARCAINE     SPECIMEN:  Source of Specimen:  Uterus, fallopian tubes and cervix.   DISPOSITION OF SPECIMEN:  PATHOLOGY  COUNTS:  YES  TOURNIQUET:  * No tourniquets in log *  DICTATION: .Note written in EPIC  PLAN OF CARE: Admit to inpatient   PATIENT DISPOSITION:  PACU - hemodynamically stable.   Delay start of Pharmacological VTE agent (>24hrs) due to surgical blood loss or risk of bleeding: no  Complications: none immediate.   Jaisa Defino L. Harraway-Smith, M.D., Cherlynn June

## 2020-04-21 NOTE — Anesthesia Postprocedure Evaluation (Signed)
Anesthesia Post Note  Patient: Architect  Procedure(s) Performed: HYSTERECTOMY ABDOMINAL WITH BILATERAL SALPINGECTOMY (N/A Abdomen)     Patient location during evaluation: PACU Anesthesia Type: General Level of consciousness: awake and alert Pain management: pain level controlled Vital Signs Assessment: post-procedure vital signs reviewed and stable Respiratory status: spontaneous breathing, nonlabored ventilation, respiratory function stable and patient connected to nasal cannula oxygen Cardiovascular status: blood pressure returned to baseline and stable Postop Assessment: no apparent nausea or vomiting Anesthetic complications: no   No complications documented.  Last Vitals:  Vitals:   04/21/20 1615 04/21/20 1630  BP: 120/88 105/81  Pulse: 88 88  Resp: 20 16  Temp:    SpO2: 100% 100%    Last Pain:  Vitals:   04/21/20 1635  TempSrc:   PainSc: 3                  Tiajuana Amass

## 2020-04-21 NOTE — Anesthesia Procedure Notes (Signed)
Anesthesia Regional Block: TAP block   Pre-Anesthetic Checklist: ,, timeout performed, Correct Patient, Correct Site, Correct Laterality, Correct Procedure, Correct Position, site marked, Risks and benefits discussed,  Surgical consent,  Pre-op evaluation,  At surgeon's request and post-op pain management  Laterality: Left and Right  Prep: chloraprep       Needles:  Injection technique: Single-shot  Needle Type: Echogenic Needle     Needle Length: 9cm  Needle Gauge: 21     Additional Needles:   Procedures:,,,, ultrasound used (permanent image in chart),,,,  Narrative:  Start time: 04/21/2020 12:35 PM End time: 04/21/2020 12:42 PM Injection made incrementally with aspirations every 5 mL.  Performed by: Personally  Anesthesiologist: Suzette Battiest, MD

## 2020-04-21 NOTE — Op Note (Signed)
04/21/2020  3:01 PM  PATIENT:  Sandra Gregory  34 y.o. female  PRE-OPERATIVE DIAGNOSIS:  FIBROIDS, PYOMETRA  POST-OPERATIVE DIAGNOSIS:  fibroids, pyometra  PROCEDURE:  Procedure(s) with comments: HYSTERECTOMY ABDOMINAL (N/A) - possible abdominal hysterectomy possible bialteral salpingectomy; cystoscopy; cultures  SURGEON:  Surgeon(s) and Role:    * Lavonia Drafts, MD - Primary    * Anyanwu, Sallyanne Havers, MD - Assisting  ANESTHESIA:   general  EBL:  300 mL   BLOOD ADMINISTERED:none  DRAINS: none   LOCAL MEDICATIONS USED:  MARCAINE     SPECIMEN:  Source of Specimen:  Uterus, fallopian tubes and cervix.   DISPOSITION OF SPECIMEN:  PATHOLOGY  COUNTS:  YES  TOURNIQUET:  * No tourniquets in log *  DICTATION: .Note written in EPIC  PLAN OF CARE: Admit to inpatient   PATIENT DISPOSITION:  PACU - hemodynamically stable.   Delay start of Pharmacological VTE agent (>24hrs) due to surgical blood loss or risk of bleeding: no  Complications: none immediate.    INDICATIONS: The patient is a 34 yo with history of uterine fibroids leading to symptomatic anemia with recent admission for fever of unknown origin and leukocytosis with  CT results suggesting possible uterine findings as the source.  I spent a significant time counseling the patient between myomectomy with the possibility of continued infection and possible need for repeat surgery verses definitve treatment with hysterectomy which would lead to infertility. The patient opted for hysterectomy. On the preoperative visit, the risks, benefits, indications, and alternatives of the procedure were reviewed with the patient. On the day of surgery, the risks of surgery were again discussed with the patient including but not limited to: bleeding which may require transfusion or reoperation; infection which may require antibiotics; injury to bowel, bladder, ureters or other surrounding organs; need for additional procedures;  thromboembolic phenomenon, incisional problems and other postoperative/anesthesia complications. Written informed consent was obtained.   OPERATIVE FINDINGS: A 24 week sized fibroids uterus (2177 grams)  with normal tubes and ovaries bilaterally.   DESCRIPTION OF PROCEDURE: The patient received intravenous antibiotics and had sequential compression devices applied to her lower extremities while in the preoperative area. She was taken to the operating room and placed under general anesthesia without difficulty. The abdomen and perineum were prepped and draped in a sterile manner, and she was placed in a dorsal supine position. A Foley catheter was inserted into the bladder and attached to constant drainage. After an adequate timeout was performed, a transverse skin incision was made. This incision was taken down to the fascia using a scalpel and cautery with care given to maintain good hemostasis. The fascia was grasped with kocher clamps, tented up and the rectus muscles were dissected off using sharp and blunt dissection on the superior and inferior aspects of the fascial incision. The peritoneum entered sharply without complication. This peritoneal cavity was entered digitally. Attention was then turned to the pelvis. The uterus at this point was noted to be mobilized and was delivered up out of the abdomen. There were some filmy adhesions of bowel to the uterus which was released with sharp dissection. The bowel was packed away with moist laparotomy sponges. The round ligaments on each side were clamped and ligated using the Ligasure, allowing entry into the broad ligament.  The anterior and posterior leaves of the broad ligament were separated, and the ureters were inspected to be safely away from the area of dissection bilaterally. The uteroovarian ligaments were bliaterally clamped and ligated using the  Ligasure instrument. The uterine arteries were skeletinized bilaterally A bladder flap was then created.  The bladder was then bluntly dissected off the lower uterine segment and cervix with good hemostasis noted. The uterine arteries were then skeletonized bilaterally and then clamped, cut, and doubly suture ligated using zero vircyl. Of note, all sutures used in this procedure are 0 Vicryl unless otherwise noted. Care was taken to prevent ureteral injury. After the uterine arteries were clamped, the uterus was removed with a scalpel and the cervical stump was grasped with a Jacob's tenaculum. The uterosacral ligaments were then clamped, cut, and ligated bilaterally. Finally, the cardinal ligaments were clamped, cut, and ligated bilaterally. The uterus was removed.  The cuff angles were closed with multiple figure-of-eight sutures.   At this point the fallopian tubes were grasped with a Babcock clamp.  Using a kelly clamp the mesosalpinx was clamped cut and suture ligated, excellent hemostasis was noted.  The pelvis was irrigated and hemostasis was reconfirmed at all pedicles and along the pelvic sidewall. The ureters were inspected and noted to be peristalsing bilaterally. A cystoscopy was performed using normal saline as the distending medium.  Methylene blue was given by anesthesia via IV and blue jets were noted from both ureters. At this point I regowned and returnee do to the abdomen.  All laparotomy sponges and instruments were removed from the abdomen. Arista was placed over the vaginal cuff and the pedicles after the pelvis was irrigated.  The peritoneum was reapproximated with 0 vicryl.  The fascia was closed with 0 vicryl . The subcutaneous fascia was closed with 3-0 vicryl and the skin was a subcuticular suture of 3-0 vicryl. The incision was injected with 30cc of 0.5% Marcaine. Benzoin and steristrips were applied.  Sponge, lap and needle counts were correct times two. The patient was taken to the recovery area awake, extubated and in stable condition.   An experienced assistant was required given the  standard of surgical care given the complexity of the case.  This assistant was needed for exposure, dissection, suctioning, retraction, instrument exchange, and for overall help during the procedure.  Fern Canova L. Harraway-Smith, M.D., Cherlynn June

## 2020-04-21 NOTE — Discharge Instructions (Signed)
Abdominal Hysterectomy, Care After This sheet gives you information about how to care for yourself after your procedure. Your doctor may also give you more specific instructions. If you have problems or questions, contact your doctor. Follow these instructions at home: Bathing  Do not take baths, swim, or use a hot tub until your doctor says it is okay. Ask your doctor if you can take showers. You may only be allowed to take sponge baths for bathing.  Keep the bandage (dressing) dry until your doctor says it can be taken off. Surgical cut (incision) care      Follow instructions from your doctor about how to take care of your cut from surgery. Make sure you: ? Wash your hands with soap and water before you change your bandage (dressing). If you cannot use soap and water, use hand sanitizer. ? Change your bandage as told by your doctor. ? Leave stitches (sutures), skin glue, or skin tape (adhesive) strips in place. They may need to stay in place for 2 weeks or longer. If tape strips get loose and curl up, you may trim the loose edges. Do not remove tape strips completely unless your doctor says it is okay.  Check your surgical cut area every day for signs of infection. Check for: ? Redness, swelling, or pain. ? Fluid or blood. ? Warmth. ? Pus or a bad smell. Activity  Do gentle, daily exercise as told by your doctor. You may be told to take short walks every day and go farther each time.  Do not lift anything that is heavier than 10 lb (4.5 kg), or the limit that your doctor tells you, until he or she says that it is safe.  Do not drive or use heavy machinery while taking prescription pain medicine.  Do not drive for 24 hours if you were given a medicine to help you relax (sedative).  Follow your doctor's advice about exercise, driving, and general activities. Ask your doctor what activities are safe for you. Lifestyle   Do not douche, use tampons, or have sex for at least 6 weeks  or as told by your doctor.  Do not drink alcohol until your doctor says it is okay.  Drink enough fluid to keep your pee (urine) clear or pale yellow.  Try to have someone at home with you for the first 1-2 weeks to help.  Do not use any products that contain nicotine or tobacco, such as cigarettes and e-cigarettes. These can slow down healing. If you need help quitting, ask your doctor. General instructions  Take over-the-counter and prescription medicines only as told by your doctor.  Do not take aspirin or ibuprofen. These medicines can cause bleeding.  To prevent or treat constipation while you are taking prescription pain medicine, your doctor may suggest that you: ? Drink enough fluid to keep your urine clear or pale yellow. ? Take over-the-counter or prescription medicines. ? Eat foods that are high in fiber, such as:  Fresh fruits and vegetables.  Whole grains.  Beans. ? Limit foods that are high in fat and processed sugars, such as fried and sweet foods.  Keep all follow-up visits as told by your doctor. This is important. Contact a doctor if:  You have chills or fever.  You have redness, swelling, or pain around your cut.  You have fluid or blood coming from your cut.  Your cut feels warm to the touch.  You have pus or a bad smell coming from your cut.    Your cut breaks open.  You feel dizzy or light-headed.  You have pain or bleeding when you pee.  You keep having watery poop (diarrhea).  You keep feeling sick to your stomach (nauseous) or keep throwing up (vomiting).  You have unusual fluid (discharge) coming from your vagina.  You have a rash.  You have a reaction to your medicine.  Your pain medicine does not help. Get help right away if:  You have a fever and your symptoms get worse all of a sudden.  You have very bad belly (abdominal) pain.  You are short of breath.  You pass out (faint).  You have pain, swelling, or redness of your  leg.  You bleed a lot from your vagina and notice clumps of blood (clots). Summary  Do not take baths, swim, or use a hot tub until your doctor says it is okay. Ask your doctor if you can take showers. You may only be allowed to take sponge baths for bathing.  Follow your doctor's advice about exercise, driving, and general activities. Ask your doctor what activities are safe for you.  Do not lift anything that is heavier than 10 lb (4.5 kg), or the limit that your doctor tells you, until he or she says that it is safe.  Try to have someone at home with you for the first 1-2 weeks to help. This information is not intended to replace advice given to you by your health care provider. Make sure you discuss any questions you have with your health care provider. Document Revised: 11/08/2018 Document Reviewed: 08/24/2016 Elsevier Patient Education  [place]. High-Calorie, High-Protein Nutrition Therapy  A high-calorie, high-protein diet has been recommended for you either because you can't eat enough calories throughout the day, have lost weight, or need to add protein to your diet. Following the recommendations on this handout can help you:  Gain weight and give your body energy  Get more protein from foods that help your body heal and grow strong  Recover from surgery or illness  Tips to Eat More Calories and Protein:  1. Aim for at [facility] 6 Meals and Snacks Each Day  Extra meals and snacks can help you get enough calories and protein.  You may want to try high-calorie supplement drinks (made at home or bought at a store) periodically between meals to get more calories each day. ? If you buy the drink at the store, read the label to look for products with 200-400 calories per serving. ? If you make the drink at home, you can increase calories by adding protein ingredients such as nonfat milk, low-fat yogurt, nonfat milk powder, or protein powder.  Enjoy snacks such as  milkshakes, smoothies, pudding, ice cream, or custard.  2. Eat More Fat  Fat provides a lot of calories in just a few bites. A tablespoon of oil, butter, or margarine has about 100 calories.  Add butter, margarine, or oil to bread, potatoes, vegetables, and soups.  Use mayonnaise, salad dressing, and peanut butter freely.  3. Choose High-Protein Foods  Enjoy milk, eggs, cheese, meat, fish, poultry, and beans. Consider trying protein powders and meal replacement shakes and bars.  Choose higher-fat meats. They have more calories than lean meats. ? Examples include chicken thighs, marbled meats, bacon, sausage, poultry with skin  Choose whole milk instead of low-fat or skim milk.  Eat high-fat cheeses instead of low-fat or nonfat cheeses.  4. Shopping Tips  Avoid diet, low-calorie, or low-fat food  items.  Look for dairy products (milk, cheese, yogurt, cottage cheese) that are labeled "whole fat" or have at least 4% fat.  Purchase nonfat dry milk powder or protein powder to use to make shakes or other blended recipes.    5. Cooking Tips  Make a high-protein milk recipe like the one below. The recipe can be prepared in advance and stored in the refrigerator until you are ready to drink it. Use this high-protein milk in recipes that call for milk or drink it as a beverage.  ? 1 cup whole milk ?  cup nonfat dry milk powder  Add cheese sauce, butter, and sour cream to vegetable and potato dishes.  Get extra calories by adding condensed milk, cream, butter, nut butters, and sweetener to hot cereals, mashed potato, pudding, and soups. Examples: ? Prepare oatmeal with condensed milk, butter/nut butter, and brown sugar ? Prepare mashed potatoes with cream, butter, and cheese ? Prepare soup with cream and extra butter, or puree the soup with cream to make a bisque ? Add cream to pudding mix or use pudding dry mix in cakes/baked goods  Serve items with extra sauces. These contain  additional calories: ? Gravy on meats and potatoes ? Extra mayonnaise, BBQ sauce or ketchup  Dipping sauces, hummus, and regular (not low-fat/low-calorie) salad dressing    Foods Recommended Calories Protein in grams (g)      Protein Foods    1 cup cooked dried beans 240 14   cup chicken salad 200 14  1 egg cooked with 1 tablespoon butter 175 6  3 ounces tuna canned in oil 170 25   cup egg substitute 25 5      1  ounce pecans (20 halves) 200 3  1 ounce macadamia nuts (10-12 nuts) 200 2  1 ounce [place] nuts (6-8 nuts) 190 4  1 ounce walnuts (14 halves) 185 4  1 ounce shelled sunflower seeds 175 6  1 ounce almonds (about 24) 165 4  1 ounce peanuts 165 7  1 tablespoon peanut butter 95 4       cup canned evaporated milk (can be used instead of water when cooking) 170 9  6 ounces sweetened yogurt 165 6   cup ice cream 130 2-3   cup (1 ounce) shredded cheese 115 7   cup creamed cottage cheese 110 13   cup half-and-half 80 2   cup whole milk (can be used instead of water when cooking) 75 4  1 tablespoon cream cheese 50 1  2 tablespoons sour cream 50 0      Fats    1 tablespoon butter, margarine, oil, or mayonnaise 100 0  2 tablespoons gravy 4 1      Sweets    1 tablespoon honey 60 0  1 tablespoon sugar, jam, jelly, or chocolate syrup 50 0      Meal Replacements    1 meal replacement bar 200 15  1 scoop (1 ounce) protein powder 100 15  1 tablespoon protein powder 40 5    High-Calorie, High-Protein Sample 1-Day Menu Breakfast 1 large egg, scrambled 1 medium biscuit 1 tablespoon jam 2 tablespoon butter 1 cup apple juice  Morning Snack 1/4 cup peanuts 1/4 cup raisins  Lunch 4 oz tuna salad (with mayonnaise, oil, relish) 1 hard-boiled egg 2 canned peach halves 2 tablespoons cream cheese 4 walnut halves 1 cup grape juice  Afternoon Snack 1/2 cup orange juice in smoothie 1/4 cup frozen strawberries in smoothie 1 banana  in smoothie 1  oz protein powder in smoothie  Evening Meal 3 oz ground beef patty 2 tablespoons gravy 3 large stalks broccoli 2 tablespoons cheese sauce 2 slices bread 1 tablespoon butter  Evening Snack 1/2 cup hummus 1 whole wheat pita

## 2020-04-21 NOTE — H&P (Signed)
I spoke to pt again. She is comfortable proceeding with the total abdominal hysterectomy with bilateral salpingectomy. She does not want the risk of leaving infected tissue or having to have a subsequent procedure. She received the blood transfusion this am and feel better. All questions answered. She will get the TAP block in the OR.   Labs, follow up notes from the medical team all reviewed. Pt has been NPO. To OR when ready.   [name], M.D., Cherlynn June

## 2020-04-22 ENCOUNTER — Encounter (HOSPITAL_COMMUNITY): Payer: Self-pay | Admitting: Obstetrics & Gynecology

## 2020-04-22 DIAGNOSIS — D219 Benign neoplasm of connective and other soft tissue, unspecified: Secondary | ICD-10-CM

## 2020-04-22 LAB — CBC
HCT: 22 % — ABNORMAL LOW (ref 36.0–46.0)
Hemoglobin: 7 g/dL — ABNORMAL LOW (ref 12.0–15.0)
MCH: 24.6 pg — ABNORMAL LOW (ref 26.0–34.0)
MCHC: 31.8 g/dL (ref 30.0–36.0)
MCV: 77.5 fL — ABNORMAL LOW (ref 80.0–100.0)
Platelets: 334 10*3/uL (ref 150–400)
RBC: 2.84 MIL/uL — ABNORMAL LOW (ref 3.87–5.11)
RDW: 19.6 % — ABNORMAL HIGH (ref 11.5–15.5)
WBC: 13.1 10*3/uL — ABNORMAL HIGH (ref 4.0–10.5)
nRBC: 0 % (ref 0.0–0.2)

## 2020-04-22 LAB — TYPE AND SCREEN
ABO/RH(D): A POS
Antibody Screen: NEGATIVE
Unit division: 0
Unit division: 0
Unit division: 0
Unit division: 0
Unit division: 0

## 2020-04-22 LAB — BASIC METABOLIC PANEL
Anion gap: 9 (ref 5–15)
BUN: <5 mg/dL — ABNORMAL LOW (ref 6–20)
CO2: 21 mmol/L — ABNORMAL LOW (ref 22–32)
Calcium: 8 mg/dL — ABNORMAL LOW (ref 8.9–10.3)
Chloride: 108 mmol/L (ref 98–111)
Creatinine, Ser: 0.72 mg/dL (ref 0.44–1.00)
GFR calc Af Amer: >60 mL/min (ref >60)
GFR calc non Af Amer: >60 mL/min (ref >60)
Glucose, Bld: 160 mg/dL — ABNORMAL HIGH (ref 70–99)
Potassium: 4.1 mmol/L (ref 3.5–5.1)
Sodium: 138 mmol/L (ref 135–145)

## 2020-04-22 LAB — BPAM RBC
Blood Product Expiration Date: 202108242359
Blood Product Expiration Date: 202108242359
Blood Product Expiration Date: 202108262359
Blood Product Expiration Date: 202108262359
Blood Product Expiration Date: 202108262359
ISSUE DATE / TIME: 202107310327
ISSUE DATE / TIME: 202107311131
ISSUE DATE / TIME: 202108030753
ISSUE DATE / TIME: 202108031140
ISSUE DATE / TIME: 202108040431
Unit Type and Rh: 6200
Unit Type and Rh: 6200
Unit Type and Rh: 6200
Unit Type and Rh: 6200
Unit Type and Rh: 6200

## 2020-04-22 LAB — CULTURE, BLOOD (SINGLE)
Culture: NO GROWTH
Special Requests: ADEQUATE

## 2020-04-22 LAB — PREPARE RBC (CROSSMATCH)

## 2020-04-22 MED ORDER — SODIUM CHLORIDE 0.9% IV SOLUTION: Freq: Once | INTRAVENOUS | Status: DC

## 2020-04-22 MED ORDER — ADULT MULTIVITAMIN W/MINERALS CH
1.0000 | ORAL_TABLET | Freq: Every day | ORAL | Status: DC
  Administered 2020-04-22 – 2020-04-23 (×2): 1 via ORAL
  Filled 2020-04-22 (×2): qty 1

## 2020-04-22 MED ORDER — ENSURE ENLIVE PO LIQD
237.0000 mL | Freq: Two times a day (BID) | ORAL | Status: DC
  Administered 2020-04-22: 237 mL via ORAL

## 2020-04-22 MED ORDER — ACETAMINOPHEN 325 MG PO TABS
650.0000 mg | ORAL_TABLET | Freq: Once | ORAL | Status: AC
  Administered 2020-04-22: 650 mg via ORAL
  Filled 2020-04-22: qty 2

## 2020-04-22 MED ORDER — SODIUM CHLORIDE 0.9 % IV SOLN
3.0000 g | Freq: Four times a day (QID) | INTRAVENOUS | Status: DC
  Administered 2020-04-22 – 2020-04-23 (×4): 3 g via INTRAVENOUS
  Filled 2020-04-22 (×6): qty 8

## 2020-04-22 NOTE — Progress Notes (Signed)
[facility]  Date of Admission:  04/17/2020     Total days of antibiotics 6         ASSESSMENT:  Sandra Gregory is POD #1 following abdominal hysterectomy with bilateral salpingectomy and feels better today but weak. Has remained clinically stable. Surgical specimen with gram positive cocci on gram stain and no growth on cultures in <24 hours. Will change antibiotics to Unasyn for now. Likely to need short course of antibiotics given surgical source control of 5-7 days. Will continue to monitor cultures.   PLAN:  1. Change ceftriaxone and metronidazole to Unasyn.  2. Monitor cultures if species is present.  3. Post surgical care per Gynecology.   Principal Problem:   Fever Active Problems:   Fibroids   Tachycardia   Iron deficiency anemia due to chronic blood loss   Anemia   . sodium chloride   Intravenous Once  . docusate sodium  100 mg Oral BID  . enoxaparin (LOVENOX) injection  40 mg Subcutaneous Q24H  . ketorolac  30 mg Intravenous Q6H   Followed by  . ibuprofen  800 mg Oral Q6H  . pantoprazole  40 mg Oral Daily    SUBJECTIVE:  Afebrile overnight with no acute events. Feeling better but weak.   No Known Allergies   Review of Systems: Review of Systems  Constitutional: Negative for chills, fever and weight loss.  Respiratory: Negative for cough, shortness of breath and wheezing.   Cardiovascular: Negative for chest pain and leg swelling.  Gastrointestinal: Negative for abdominal pain, constipation, diarrhea, nausea and vomiting.  Skin: Negative for rash.      OBJECTIVE: Vitals:   04/21/20 1946 04/22/20 0109 04/22/20 0401 04/22/20 1002  BP: 102/72 99/75 99/73  101/60  Pulse: 79 (!) 59 (!) 52 63  Resp: 18 17 17 18   Temp: 98 F (36.7 C) (!) 97.4 F (36.3 C) (!) 97.4 F (36.3 C) (!) 97.5 F (36.4 C)  TempSrc: Oral Oral Axillary Oral  SpO2: 100%  100% 100%  Weight:      Height:       Body mass index is 28.24 kg/m.  Physical  Exam Constitutional:      General: She is not in acute distress.    Appearance: She is well-developed.  Cardiovascular:     Rate and Rhythm: Normal rate and regular rhythm.     Heart sounds: Normal heart sounds.  Pulmonary:     Effort: Pulmonary effort is normal.     Breath sounds: Normal breath sounds.  Skin:    General: Skin is warm and dry.  Neurological:     Mental Status: She is alert and oriented to person, place, and time.  Psychiatric:        Behavior: Behavior normal.        Thought Content: Thought content normal.        Judgment: Judgment normal.     Lab Results Lab Results  Component Value Date   WBC 13.1 (H) 04/22/2020   HGB 7.0 (L) 04/22/2020   HCT 22.0 (L) 04/22/2020   MCV 77.5 (L) 04/22/2020   PLT 334 04/22/2020    Lab Results  Component Value Date   CREATININE 0.72 04/22/2020   BUN <5 (L) 04/22/2020   NA 138 04/22/2020   K 4.1 04/22/2020   CL 108 04/22/2020   CO2 21 (L) 04/22/2020    Lab Results  Component Value Date   ALT 12 04/20/2020   AST 15 04/20/2020  ALKPHOS 84 04/20/2020   BILITOT 0.8 04/20/2020     Microbiology: Recent Results (from the past 240 hour(s))  Blood culture (routine single)     Status: None   Collection Time: 04/17/20  5:00 PM   Specimen: BLOOD  Result Value Ref Range Status   Specimen Description BLOOD SITE NOT SPECIFIED  Final   Special Requests   Final    BOTTLES DRAWN AEROBIC AND ANAEROBIC Blood Culture adequate volume   Culture   Final    NO GROWTH 5 DAYS Performed at [facility], [address], [place], [place] [address]    Report Status 04/22/2020 FINAL  Final  Urine culture     Status: Abnormal   Collection Time: 04/17/20  6:58 PM   Specimen: In/Out Cath Urine  Result Value Ref Range Status   Specimen Description IN/OUT CATH URINE  Final   Special Requests   Final    NONE Performed at [facility], [place] [address], [place], [place] [address]    Culture MULTIPLE SPECIES PRESENT, SUGGEST  RECOLLECTION (A)  Final   Report Status 04/19/2020 FINAL  Final  Wet prep, genital     Status: Abnormal   Collection Time: 04/17/20  7:13 PM  Result Value Ref Range Status   Yeast Wet Prep HPF POC NONE SEEN NONE SEEN Final   Trich, Wet Prep NONE SEEN NONE SEEN Final   Clue Cells Wet Prep HPF POC NONE SEEN NONE SEEN Final   WBC, Wet Prep HPF POC FEW (A) NONE SEEN Final   Sperm NONE SEEN  Final    Comment: Performed at [facility], [place] [address] [place], [place] [address]  SARS Coronavirus 2 by RT PCR (hospital order, performed in [facility] hospital lab) Nasopharyngeal Nasopharyngeal Swab     Status: None   Collection Time: 04/17/20  7:55 PM   Specimen: Nasopharyngeal Swab  Result Value Ref Range Status   SARS Coronavirus 2 NEGATIVE NEGATIVE Final    Comment: (NOTE) SARS-CoV-2 target nucleic acids are NOT DETECTED.  The SARS-CoV-2 RNA is generally detectable in upper and lower respiratory specimens during the acute phase of infection. The lowest concentration of SARS-CoV-2 viral copies this assay can detect is 250 copies / mL. A negative result does not preclude SARS-CoV-2 infection and should not be used as the sole basis for treatment or other patient management decisions.  A negative result may occur with improper specimen collection / handling, submission of specimen other than nasopharyngeal swab, presence of viral mutation(s) within the areas targeted by this assay, and inadequate number of viral copies (<250 copies / mL). A negative result must be combined with clinical observations, patient history, and epidemiological information.  Fact Sheet for Patients:   StrictlyIdeas.no  Fact Sheet for Healthcare Providers: BankingDealers.co.za  This test is not yet approved or  cleared by the [place] FDA and has been authorized for detection and/or diagnosis of SARS-CoV-2 by FDA under an Emergency Use Authorization  (EUA).  This EUA will remain in effect (meaning this test can be used) for the duration of the COVID-19 declaration under Section 564(b)(1) of the Act, 21 U.S.C. section 360bbb-3(b)(1), unless the authorization is terminated or revoked sooner.  Performed at [facility], [place] [address] [place], [place] [address]   Culture, blood (single)     Status: None (Preliminary result)   Collection Time: 04/18/20  2:38 AM   Specimen: BLOOD  Result Value Ref Range Status  Specimen Description BLOOD RIGHT FOREARM  Final   Special Requests   Final    BOTTLES DRAWN AEROBIC AND ANAEROBIC Blood Culture adequate volume   Culture   Final    NO GROWTH 4 DAYS Performed at [facility], [address] [place], [place] [address]    Report Status PENDING  Incomplete  Urine Culture     Status: None   Collection Time: 04/19/20 11:05 AM   Specimen: Urine, Random  Result Value Ref Range Status   Specimen Description URINE, RANDOM  Final   Special Requests NONE  Final   Culture   Final    NO GROWTH Performed at [facility], [place] [address], [place], [place] [address]    Report Status 04/20/2020 FINAL  Final  C Difficile Quick Screen w PCR reflex     Status: None   Collection Time: 04/19/20  2:37 PM   Specimen: STOOL  Result Value Ref Range Status   C Diff antigen NEGATIVE NEGATIVE Final   C Diff toxin NEGATIVE NEGATIVE Final   C Diff interpretation No C. difficile detected.  Final    Comment: Performed at [facility], [place] [address], [place], [place] [address]  Surgical pcr screen     Status: None   Collection Time: 04/20/20  3:23 AM   Specimen: Nasal Mucosa; Nasal Swab  Result Value Ref Range Status   MRSA, PCR NEGATIVE NEGATIVE Final   Staphylococcus aureus NEGATIVE NEGATIVE Final    Comment: (NOTE) The Xpert SA Assay (FDA approved for NASAL specimens in patients 69 years of age and older), is one component of a comprehensive surveillance program. It is not intended  to diagnose infection nor to guide or monitor treatment. Performed at [facility], [place] [address] [place], [place] [address]   Aerobic/Anaerobic Culture (surgical/deep wound)     Status: None (Preliminary result)   Collection Time: 04/21/20  1:16 PM   Specimen: Wound; Abscess  Result Value Ref Range Status   Specimen Description WOUND UTERUS  Final   Special Requests NONE  Final   Gram Stain   Final    RARE WBC PRESENT, PREDOMINANTLY PMN RARE GRAM POSITIVE COCCI    Culture   Final    NO GROWTH < 24 HOURS Performed at [place] [address], [place], [place] [address]    Report Status PENDING  Incomplete     Terri Piedra, [facility]  04/22/2020  1:29 PM

## 2020-04-22 NOTE — Progress Notes (Addendum)
1 Day Post-Op Procedure(s) (LRB): HYSTERECTOMY ABDOMINAL WITH BILATERAL SALPINGECTOMY (N/A)  Subjective: Patient reports tolerating PO and + flatus.  She was able to tolerate a smoothie. She reports a decreased appetite but, no nausea or emesis. She does feel that she is 'weak' she denies dizziness but, felt somewhat unstable on her feet last night. When she was asked about a transfusion, she thinks her sx are related to anemia.    Objective: I have reviewed patient's vital signs, intake and output, medications, labs and microbiology. I/O last 3 completed shifts: In: 6531.1 [P.O.:240; I.V.:5497.4; Blood:315; IV Piggyback:478.7] Out: 4250 [Urine:3950; Blood:300] No intake/output data recorded.  General: alert and no distress Resp: clear to auscultation bilaterally Cardio: regular rate and rhythm, S1, S2 normal, no murmur, click, rub or gallop GI: soft, non-tender; bowel sounds normal; no masses,  no organomegaly and incision: clean and dry Extremities: extremities normal, atraumatic, no cyanosis or edema Vaginal Bleeding: none   CBC Latest Ref Rng & Units 04/22/2020 04/21/2020 04/21/2020  WBC 4.0 - 10.5 K/uL 13.1(H) - 18.0(H)  Hemoglobin 12.0 - 15.0 g/dL 7.0(L) 8.8(L) 7.4(L)  Hematocrit 36 - 46 % 22.0(L) 26.0(L) 23.3(L)  Platelets 150 - 400 K/uL 334 - 409(H)   Assessment: s/p Procedure(s) with comments: HYSTERECTOMY ABDOMINAL WITH BILATERAL SALPINGECTOMY (N/A) - possible abdominal hysterectomy possible bialteral salpingectomy: stable, progressing well and anemia  Plan: Advance diet Encourage ambulation Discontinue IV fluids Given the HGB/HCT and pts sx will transfuse 1 unit of RBCs.    Will consult Dr. Baxter Flattery on the duration of atbx. The pts WBC is decreased and she has been afebrile. Suspect 24 more hours but, will await her opinion.  Keep Ceftriaxone and Flagyl for now.  F/u pathology.     Suspect discharge on tomorrow if cont afebrile and stable post op course.     LOS: 4 days     Lavonia Drafts 04/22/2020, 8:21 AM

## 2020-04-22 NOTE — Progress Notes (Signed)
PROGRESS NOTE    Sandra Gregory  RWE:[id number] DOB: 05/25/1986 DOA: 04/17/2020 PCP: Default, Provider, MD    Brief Narrative:  This is a 34 year old female who states she had a miscarriage May 25. She states since then she cannot stand for long time because of dizziness. She says every time she stands she gets dizzy. She states she always feels presyncopal. At one point she had complete loss of appetite but that has since resolved. As result she had loss of weight loss. She states she has been weak, to the point where after shower she has to lay down. She denies any pain anywhere including abdominal. She does have shortness of breath but this is mainly associated with activity. She has a history of anemia and has been on iron tablets most of her adult life.  Today she came in for preop testing for myomectomy. Vitals show tachycardia up to 140, temperature of 102.4. She was sent to the ER. She met sepsis criteria. With a white blood count of 21 and hemoglobin of 6.7.  Patient has a history of metromenorrhagia. She has several large fibroids with up to12 cm long. She uses 8-hour sanitary napkins and goes through approximately 4 a day. The patient states she has not been ill over the past few days, no fevers or chills, not asymptomatic with illness.    Consultants:   OB/GYN  Procedures: Status post hysterectomy with bilateral salpingectomy  Antimicrobials:   *ceftriaxone and flagyl   Subjective: No complaints. Ambulating around the hallway  Objective: Vitals:   04/22/20 1002 04/22/20 1402 04/22/20 1456 04/22/20 1515  BP: 101/60 (!) 112/59 114/70 104/72  Pulse: 63 61 62 61  Resp: _0 Temp: (!) 97.5 F (36.4 C) (!) 97.5 F (36.4 C) (!) 97.5 F (36.4 C) (!) 97.5 F (36.4 C)  TempSrc: Oral Oral Oral Oral  SpO2: 100% 100%  100%  Weight:      Height:        Intake/Output Summary (Last 24 hours) at 04/22/2020 1539 Last data filed at 04/22/2020 0649 Gross per 24 hour    Intake 1755.42 ml  Output 3750 ml  Net -1994.58 ml   Filed Weights   04/17/20 1508 04/18/20 2055  Weight: 76.7 kg 81.8 kg    Examination:  General exam: Appears calm and comfortable  Respiratory system: Clear to auscultation. Respiratory effort normal. Cardiovascular system: S1 & S2 heard, RRR. No JVD, murmurs, rubs, gallops or clicks.  Gastrointestinal system: Abdomen is nondistended, soft and nontender.  Normal bowel sounds heard. Central nervous system: Alert and oriented. No focal neurological deficits. Extremities: no edema Skin: warm, dry Psychiatry: Judgement and insight appear normal. Mood & affect appropriate.     Data Reviewed: I have personally reviewed following labs and imaging studies  CBC: Recent Labs  Lab 04/17/20 1645 04/17/20 1645 04/18/20 0857 04/18/20 2018 04/19/20 0932 04/20/20 0912 04/21/20 0637 04/21/20 1245 04/22/20 0329  WBC 21.8*   < > 18.7*  --  28.8* 26.7* 18.0*  --  13.1*  NEUTROABS 18.9*  --   --   --   --   --   --   --   --   HGB 6.7*   < > 7.3*   < > 8.7* 8.5* 7.4* 8.8* 7.0*  HCT 22.0*   < > 22.8*   < > 26.5* 27.1* 23.3* 26.0* 22.0*  MCV 75.1*   < > 72.8*  --  73.8* 76.6* 74.2*  --  77.5*  PLT 499*   < > 468*  --  475* 472* 409*  --  334   < > = values in this interval not displayed.   Basic Metabolic Panel: Recent Labs  Lab 04/18/20 0857 04/18/20 0857 04/19/20 0932 04/20/20 0912 04/21/20 0637 04/21/20 1245 04/22/20 0329  NA 136   < > 136 138 136 142 138  K 3.3*   < > 3.3* 3.1* 3.1* 3.1* 4.1  CL 104   < > 103 107 107 105 108  CO2 24  --  21* 19* 20*  --  21*  GLUCOSE 109*   < > 126* 96 86 98 160*  BUN <5*   < > <5* <5* <5* <3* <5*  CREATININE 0.78   < > 0.93 0.82 0.77 0.60 0.72  CALCIUM 8.7*  --  8.8* 8.5* 8.2*  --  8.0*  MG 1.8  --   --   --   --   --   --    < > = values in this interval not displayed.   GFR: Estimated Creatinine Clearance: 111.1 mL/min (by C-G formula based on SCr of 0.72 mg/dL). Liver Function  Tests: Recent Labs  Lab 04/17/20 1645 04/18/20 2018 04/20/20 0912  AST _0 ALT _1 ALKPHOS 77 82 84  BILITOT 0.8 1.1 0.8  PROT 8.0 8.3* 7.3  ALBUMIN 2.7* 2.6* 2.2*   Recent Labs  Lab 04/18/20 2018  LIPASE 18   No results for input(s): AMMONIA in the last 168 hours. Coagulation Profile: Recent Labs  Lab 04/17/20 1645  INR 1.2   Cardiac Enzymes: No results for input(s): CKTOTAL, CKMB, CKMBINDEX, TROPONINI in the last 168 hours. BNP (last 3 results) No results for input(s): PROBNP in the last 8760 hours. HbA1C: No results for input(s): HGBA1C in the last 72 hours. CBG: No results for input(s): GLUCAP in the last 168 hours. Lipid Profile: No results for input(s): CHOL, HDL, LDLCALC, TRIG, CHOLHDL, LDLDIRECT in the last 72 hours. Thyroid Function Tests: No results for input(s): TSH, T4TOTAL, FREET4, T3FREE, THYROIDAB in the last 72 hours. Anemia Panel: No results for input(s): VITAMINB12, FOLATE, FERRITIN, TIBC, IRON, RETICCTPCT in the last 72 hours. Sepsis Labs: Recent Labs  Lab 04/17/20 1618 04/17/20 1958 04/19/20 1116  LATICACIDVEN 1.4 1.1 1.1    Recent Results (from the past 240 hour(s))  Blood culture (routine single)     Status: None   Collection Time: 04/17/20  5:00 PM   Specimen: BLOOD  Result Value Ref Range Status   Specimen Description BLOOD SITE NOT SPECIFIED  Final   Special Requests   Final    BOTTLES DRAWN AEROBIC AND ANAEROBIC Blood Culture adequate volume   Culture   Final    NO GROWTH 5 DAYS Performed at [facility], [address] [place], [place] [address]    Report Status 04/22/2020 FINAL  Final  Urine culture     Status: Abnormal   Collection Time: 04/17/20  6:58 PM   Specimen: In/Out Cath Urine  Result Value Ref Range Status   Specimen Description IN/OUT CATH URINE  Final   Special Requests   Final    NONE Performed at [facility], [place] [address] [place], [place] [address]    Culture MULTIPLE SPECIES  PRESENT, SUGGEST RECOLLECTION (A)  Final   Report Status 04/19/2020 FINAL  Final  Wet prep, genital     Status: Abnormal   Collection Time: 04/17/20  7:13  PM  Result Value Ref Range Status   Yeast Wet Prep HPF POC NONE SEEN NONE SEEN Final   Trich, Wet Prep NONE SEEN NONE SEEN Final   Clue Cells Wet Prep HPF POC NONE SEEN NONE SEEN Final   WBC, Wet Prep HPF POC FEW (A) NONE SEEN Final   Sperm NONE SEEN  Final    Comment: Performed at [facility], [place] [address], [place], [place] [address]  SARS Coronavirus 2 by RT PCR (hospital order, performed in [facility] hospital lab) Nasopharyngeal Nasopharyngeal Swab     Status: None   Collection Time: 04/17/20  7:55 PM   Specimen: Nasopharyngeal Swab  Result Value Ref Range Status   SARS Coronavirus 2 NEGATIVE NEGATIVE Final    Comment: (NOTE) SARS-CoV-2 target nucleic acids are NOT DETECTED.  The SARS-CoV-2 RNA is generally detectable in upper and lower respiratory specimens during the acute phase of infection. The lowest concentration of SARS-CoV-2 viral copies this assay can detect is 250 copies / mL. A negative result does not preclude SARS-CoV-2 infection and should not be used as the sole basis for treatment or other patient management decisions.  A negative result may occur with improper specimen collection / handling, submission of specimen other than nasopharyngeal swab, presence of viral mutation(s) within the areas targeted by this assay, and inadequate number of viral copies (<250 copies / mL). A negative result must be combined with clinical observations, patient history, and epidemiological information.  Fact Sheet for Patients:   StrictlyIdeas.no  Fact Sheet for Healthcare Providers: BankingDealers.co.za  This test is not yet approved or  cleared by the [place] FDA and has been authorized for detection and/or diagnosis of SARS-CoV-2 by FDA under an Emergency Use  Authorization (EUA).  This EUA will remain in effect (meaning this test can be used) for the duration of the COVID-19 declaration under Section 564(b)(1) of the Act, 21 U.S.C. section 360bbb-3(b)(1), unless the authorization is terminated or revoked sooner.  Performed at [facility], [place] [address] [place], [place] [address]   Culture, blood (single)     Status: None (Preliminary result)   Collection Time: 04/18/20  2:38 AM   Specimen: BLOOD  Result Value Ref Range Status   Specimen Description BLOOD RIGHT FOREARM  Final   Special Requests   Final    BOTTLES DRAWN AEROBIC AND ANAEROBIC Blood Culture adequate volume   Culture   Final    NO GROWTH 4 DAYS Performed at [facility], [place] [address], [place], [place] [address]    Report Status PENDING  Incomplete  Urine Culture     Status: None   Collection Time: 04/19/20 11:05 AM   Specimen: Urine, Random  Result Value Ref Range Status   Specimen Description URINE, RANDOM  Final   Special Requests NONE  Final   Culture   Final    NO GROWTH Performed at [facility], [place] [address] [place], [place] [address]    Report Status 04/20/2020 FINAL  Final  C Difficile Quick Screen w PCR reflex     Status: None   Collection Time: 04/19/20  2:37 PM   Specimen: STOOL  Result Value Ref Range Status   C Diff antigen NEGATIVE NEGATIVE Final   C Diff toxin NEGATIVE NEGATIVE Final   C Diff interpretation No C. difficile detected.  Final    Comment: Performed at [facility], [place] [address] [place], [place] [address]  Surgical pcr screen     Status: None   Collection Time: 04/20/20  3:23 AM   Specimen: Nasal Mucosa; Nasal Swab  Result Value Ref Range Status   MRSA, PCR NEGATIVE NEGATIVE Final   Staphylococcus aureus NEGATIVE NEGATIVE Final    Comment: (NOTE) The Xpert SA Assay (FDA approved for NASAL specimens in patients 49 years of age and older), is one component of a comprehensive surveillance program. It is  not intended to diagnose infection nor to guide or monitor treatment. Performed at [facility], [place] [address], [place], [place] [address]   Aerobic/Anaerobic Culture (surgical/deep wound)     Status: None (Preliminary result)   Collection Time: 04/21/20  1:16 PM   Specimen: Wound; Abscess  Result Value Ref Range Status   Specimen Description WOUND UTERUS  Final   Special Requests NONE  Final   Gram Stain   Final    RARE WBC PRESENT, PREDOMINANTLY PMN RARE GRAM POSITIVE COCCI    Culture   Final    NO GROWTH < 24 HOURS Performed at [place] [address] [place], [place] [address]    Report Status PENDING  Incomplete         Radiology Studies: No results found.      Scheduled Meds: . sodium chloride   Intravenous Once  . sodium chloride   Intravenous Once  . docusate sodium  100 mg Oral BID  . enoxaparin (LOVENOX) injection  40 mg Subcutaneous Q24H  . feeding supplement (ENSURE ENLIVE)  237 mL Oral BID BM  . ibuprofen  800 mg Oral Q6H  . multivitamin with minerals  1 tablet Oral Daily  . pantoprazole  40 mg Oral Daily   Continuous Infusions: . ampicillin-sulbactam (UNASYN) IV 3 g (04/22/20 1334)    Assessment & Plan:   Principal Problem:   Fever Active Problems:   Fibroids   Tachycardia   Iron deficiency anemia due to chronic blood loss   Anemia    1-Symptomatic anemia due to iron deficiency likely from menorrhagia: Received 2 units of packed red blood cells 7/31. Continue with oral ferrous sulfate.  H/h down today, will transfuse one unit prbc .   2-Sepsis: Patient presented with fever tachycardia leukocytosis. Suspect related to uterine fibroid, necrotics.  Covid 19 Negative.  Chest X-ray: Negative UA; moderate leukocytes, white blood cells 6-10. Pelvis ultrasound: Enlarged myomatous uterus, unremarkable right ovary, nonvisualization of the endometrium and left ovary. Urine culture: multiple morphology, resend urine culture:  no growth to date.  Blood culture; No growth to date.  Ct abdomen pelvis: Markedly enlarged fibroid uterus measuring 22 cm, dominant fibroid with lower uterine segment 10.2 cm containing multiple foci of air superimposed infection not excluded.  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  C diff negative.  ID following- started on Unasyn.    3-Hypokalemia: Repleted. K 4.1 stable.   4-Fibroid menorrhagia: mx Per OBGYN S/p hysterectomy abd. With b/l salpingectomy.   5-Tachycardia; form Dehydration, anemia, fever.  IV fluids Improved  6--Diarrhea; C diff negative. CT scan non revealing.  Improved.    DVT prophylaxis: lovenox Code Status:full Family Communication: family at bedside updated Disposition Plan: home Status is: Inpatient  Remains inpatient appropriate because:IV treatments appropriate due to intensity of illness or inability to take PO   Dispo: The patient is from: Home              Anticipated d/c is to: Home  Anticipated d/c date is: 2 days              Patient currently is not medically stable to d/c. Needs clearance from obgyn when to dc once medically stable.  Spoke to Dr. Kennon Rounds, will tranfer patient to OBGYN as the primary team            LOS: 4 days   Time spent: 35 min with >50% on coc    Nolberto Hanlon, MD [facility] Pager [phone]  If 7PM-7AM, please contact night-coverage [email or web address] Password [facility] 04/22/2020, 3:39 PM

## 2020-04-22 NOTE — Progress Notes (Signed)
Nutrition Follow-up  DOCUMENTATION CODES:   Not applicable  INTERVENTION:   -Ensure Enlive po BID, each supplement provides 350 kcal and 20 grams of protein -MVI with minerals daily  NUTRITION DIAGNOSIS:   Inadequate oral intake related to poor appetite, early satiety as evidenced by per patient/family report.  Ongoing  GOAL:   Patient will meet greater than or equal to 90% of their needs  Progressing   MONITOR:   PO intake, Supplement acceptance, Labs, Weight trends  REASON FOR ASSESSMENT:   Malnutrition Screening Tool    ASSESSMENT:   34 year old female who presented on 7/30 for pre-op testing for uterine myomectomy. PMH of miscarriage in May 2021, depression, anxiety, anemia. Pt admitted with sepsis.  8/3- s/p Procedure(s) with comments: HYSTERECTOMY ABDOMINAL (N/A) - possible abdominal hysterectomy possible bialteral salpingectomy; cystoscopy; cultures  Reviewed I/O's: +20 ml x 24 hours and +11.3 L since admission  UOP: 4 L x 24 hours  Spoke with pt at bedside, who was pleasant and in good spirits at time of visit. She reports that her appetite has improved over the past week, but still has nausea with hot, highly seasoned foods. She was able to consume yogurt and cereal for breakfast without difficulty. Pt reports she ordered a smoothie for lunch, which she is looking forward to. She also enjoys vanilla Ensure supplements.   Reviewed menu choices with pt and encouraged pt to choose colder food items to help combat nausea. She reports she is willing to continue Ensure supplements.   Medications reviewed and include colace and dextrose 5% and 0.45% NaCl with KCl 40 mEq/L infusion @ 125 ml/hr.   Labs reviewed.   NUTRITION - FOCUSED PHYSICAL EXAM:    Most Recent Value  Orbital Region No depletion  Upper Arm Region No depletion  Thoracic and Lumbar Region No depletion  Buccal Region No depletion  Temple Region No depletion  Clavicle Bone Region No depletion   Clavicle and Acromion Bone Region No depletion  Scapular Bone Region No depletion  Dorsal Hand No depletion  Patellar Region No depletion  Anterior Thigh Region No depletion  Posterior Calf Region No depletion  Edema (RD Assessment) None  Hair Reviewed  Eyes Reviewed  Mouth Reviewed  Skin Reviewed  Nails Reviewed       Diet Order:   Diet Order            Diet regular Room service appropriate? Yes; Fluid consistency: Thin  Diet effective now                 EDUCATION NEEDS:   Education needs have been addressed  Skin:  Skin Assessment: Skin Integrity Issues: Skin Integrity Issues:: Incisions Incisions: closed vagina and abdomen  Last BM:  04/20/20  Height:   Ht Readings from Last 1 Encounters:  04/17/20 5\' 7"  (1.702 m)    Weight:   Wt Readings from Last 1 Encounters:  04/18/20 81.8 kg    Ideal Body Weight:  61.4 kg  BMI:  Body mass index is 28.24 kg/m.  Estimated Nutritional Needs:   Kcal:  2000-2200  Protein:  95-110 grams  Fluid:  >/= 2.0 L    Loistine Chance, RD, LDN, [place] Registered Dietitian II Certified Diabetes Care and Education Specialist Please refer to [facility] for RD and/or RD on-call/weekend/after hours pager

## 2020-04-23 DIAGNOSIS — Z5189 Encounter for other specified aftercare: Secondary | ICD-10-CM

## 2020-04-23 LAB — CULTURE, BLOOD (SINGLE)
Culture: NO GROWTH
Special Requests: ADEQUATE

## 2020-04-23 LAB — BPAM RBC
Blood Product Expiration Date: 202108282359
ISSUE DATE / TIME: 202108041454
Unit Type and Rh: 6200

## 2020-04-23 LAB — TYPE AND SCREEN
ABO/RH(D): A POS
Antibody Screen: NEGATIVE
Unit division: 0

## 2020-04-23 LAB — CBC
HCT: 26.3 % — ABNORMAL LOW (ref 36.0–46.0)
Hemoglobin: 8.5 g/dL — ABNORMAL LOW (ref 12.0–15.0)
MCH: 25.4 pg — ABNORMAL LOW (ref 26.0–34.0)
MCHC: 32.3 g/dL (ref 30.0–36.0)
MCV: 78.5 fL — ABNORMAL LOW (ref 80.0–100.0)
Platelets: 394 10*3/uL (ref 150–400)
RBC: 3.35 MIL/uL — ABNORMAL LOW (ref 3.87–5.11)
RDW: 19.3 % — ABNORMAL HIGH (ref 11.5–15.5)
WBC: 15 10*3/uL — ABNORMAL HIGH (ref 4.0–10.5)
nRBC: 0 % (ref 0.0–0.2)

## 2020-04-23 LAB — SURGICAL PATHOLOGY

## 2020-04-23 MED ORDER — METRONIDAZOLE 500 MG PO TABS
500.0000 mg | ORAL_TABLET | Freq: Two times a day (BID) | ORAL | 0 refills | Status: AC
Start: 2020-04-23 — End: ?

## 2020-04-23 MED ORDER — AMOXICILLIN-POT CLAVULANATE 875-125 MG PO TABS
1.0000 | ORAL_TABLET | Freq: Two times a day (BID) | ORAL | 0 refills | Status: AC
Start: 2020-04-23 — End: ?

## 2020-04-23 MED ORDER — OXYCODONE-ACETAMINOPHEN 5-325 MG PO TABS: 1.0000 | ORAL_TABLET | Freq: Four times a day (QID) | ORAL | 0 refills | Status: AC | PRN

## 2020-04-23 MED ORDER — DIPHENHYDRAMINE HCL 50 MG/ML IJ SOLN
25.0000 mg | Freq: Four times a day (QID) | INTRAMUSCULAR | Status: DC | PRN
  Administered 2020-04-23 (×2): 25 mg via INTRAVENOUS
  Filled 2020-04-23 (×2): qty 1

## 2020-04-23 MED ORDER — DOCUSATE SODIUM 100 MG PO CAPS: 100.0000 mg | ORAL_CAPSULE | Freq: Two times a day (BID) | ORAL | 0 refills | Status: AC

## 2020-04-23 MED ORDER — IBUPROFEN 800 MG PO TABS: 800.0000 mg | ORAL_TABLET | Freq: Four times a day (QID) | ORAL | 0 refills | Status: AC

## 2020-04-23 NOTE — Progress Notes (Signed)
[facility] for Infectious Disease  Date of Admission:  04/17/2020     Total days of antibiotics 7         ASSESSMENT:  Ms. Witting is POD #2 from abdominal hysterectomy and continues to remain without fever and doing well. Surgical specimens remain without growth to date. Will proceed with 5 days of Augmentin. Follow up with PCP and gynecology as needed.   PLAN:  1. Discharge on Augmentin 875/125 mg for 5 days total. 2. Follow up with PCP and/or Gynecology.   Principal Problem:   Fever Active Problems:   Fibroids   Tachycardia   Iron deficiency anemia due to chronic blood loss   Anemia   . sodium chloride   Intravenous Once  . sodium chloride   Intravenous Once  . docusate sodium  100 mg Oral BID  . enoxaparin (LOVENOX) injection  40 mg Subcutaneous Q24H  . feeding supplement (ENSURE ENLIVE)  237 mL Oral BID BM  . ibuprofen  800 mg Oral Q6H  . multivitamin with minerals  1 tablet Oral Daily  . pantoprazole  40 mg Oral Daily    SUBJECTIVE:  Afebrile overnight with no acute events. Feeling much better and more energized today. Ready to go home.   No Known Allergies   Review of Systems: Review of Systems  Constitutional: Negative for chills, fever and weight loss.  Respiratory: Negative for cough, shortness of breath and wheezing.   Cardiovascular: Negative for chest pain and leg swelling.  Gastrointestinal: Negative for abdominal pain, constipation, diarrhea, nausea and vomiting.  Skin: Negative for rash.      OBJECTIVE: Vitals:   04/22/20 1456 04/22/20 1515 04/22/20 1858 04/23/20 0456  BP: 114/70 104/72 101/80 95/62  Pulse: 62 61 61 (!) 51  Resp: 17 18 17 17   Temp: (!) 97.5 F (36.4 C) (!) 97.5 F (36.4 C) 97.6 F (36.4 C)   TempSrc: Oral Oral Oral   SpO2:  100% 100% 100%  Weight:      Height:       Body mass index is 28.24 kg/m.  Physical Exam Constitutional:      General: She is not in acute distress.    Appearance: She is  well-developed.  Cardiovascular:     Rate and Rhythm: Normal rate and regular rhythm.     Heart sounds: Normal heart sounds.  Pulmonary:     Effort: Pulmonary effort is normal.     Breath sounds: Normal breath sounds.  Skin:    General: Skin is warm and dry.  Neurological:     Mental Status: She is alert and oriented to person, place, and time.  Psychiatric:        Behavior: Behavior normal.        Thought Content: Thought content normal.        Judgment: Judgment normal.     Lab Results Lab Results  Component Value Date   WBC 15.0 (H) 04/23/2020   HGB 8.5 (L) 04/23/2020   HCT 26.3 (L) 04/23/2020   MCV 78.5 (L) 04/23/2020   PLT 394 04/23/2020    Lab Results  Component Value Date   CREATININE 0.72 04/22/2020   BUN <5 (L) 04/22/2020   NA 138 04/22/2020   K 4.1 04/22/2020   CL 108 04/22/2020   CO2 21 (L) 04/22/2020    Lab Results  Component Value Date   ALT 12 04/20/2020   AST 15 04/20/2020   ALKPHOS 84 04/20/2020   BILITOT 0.8 04/20/2020  Microbiology: Recent Results (from the past 240 hour(s))  Blood culture (routine single)     Status: None   Collection Time: 04/17/20  5:00 PM   Specimen: BLOOD  Result Value Ref Range Status   Specimen Description BLOOD SITE NOT SPECIFIED  Final   Special Requests   Final    BOTTLES DRAWN AEROBIC AND ANAEROBIC Blood Culture adequate volume   Culture   Final    NO GROWTH 5 DAYS Performed at [facility], [address] [place], [place] [address]    Report Status 04/22/2020 FINAL  Final  Urine culture     Status: Abnormal   Collection Time: 04/17/20  6:58 PM   Specimen: In/Out Cath Urine  Result Value Ref Range Status   Specimen Description IN/OUT CATH URINE  Final   Special Requests   Final    NONE Performed at [facility], [place] [address], [place], [place] [address]    Culture MULTIPLE SPECIES PRESENT, SUGGEST RECOLLECTION (A)  Final   Report Status 04/19/2020 FINAL  Final  Wet prep, genital      Status: Abnormal   Collection Time: 04/17/20  7:13 PM  Result Value Ref Range Status   Yeast Wet Prep HPF POC NONE SEEN NONE SEEN Final   Trich, Wet Prep NONE SEEN NONE SEEN Final   Clue Cells Wet Prep HPF POC NONE SEEN NONE SEEN Final   WBC, Wet Prep HPF POC FEW (A) NONE SEEN Final   Sperm NONE SEEN  Final    Comment: Performed at [facility], [place] [address] [place], [place] [address]  SARS Coronavirus 2 by RT PCR (hospital order, performed in [facility] hospital lab) Nasopharyngeal Nasopharyngeal Swab     Status: None   Collection Time: 04/17/20  7:55 PM   Specimen: Nasopharyngeal Swab  Result Value Ref Range Status   SARS Coronavirus 2 NEGATIVE NEGATIVE Final    Comment: (NOTE) SARS-CoV-2 target nucleic acids are NOT DETECTED.  The SARS-CoV-2 RNA is generally detectable in upper and lower respiratory specimens during the acute phase of infection. The lowest concentration of SARS-CoV-2 viral copies this assay can detect is 250 copies / mL. A negative result does not preclude SARS-CoV-2 infection and should not be used as the sole basis for treatment or other patient management decisions.  A negative result may occur with improper specimen collection / handling, submission of specimen other than nasopharyngeal swab, presence of viral mutation(s) within the areas targeted by this assay, and inadequate number of viral copies (<250 copies / mL). A negative result must be combined with clinical observations, patient history, and epidemiological information.  Fact Sheet for Patients:   StrictlyIdeas.no  Fact Sheet for Healthcare Providers: BankingDealers.co.za  This test is not yet approved or  cleared by the [place] FDA and has been authorized for detection and/or diagnosis of SARS-CoV-2 by FDA under an Emergency Use Authorization (EUA).  This EUA will remain in effect (meaning this test can be used) for the duration of  the COVID-19 declaration under Section 564(b)(1) of the Act, 21 U.S.C. section 360bbb-3(b)(1), unless the authorization is terminated or revoked sooner.  Performed at [facility], [place] [address], [place], [place] [address]   Culture, blood (single)     Status: None (Preliminary result)   Collection Time: 04/18/20  2:38 AM   Specimen: BLOOD  Result Value Ref Range Status   Specimen Description BLOOD RIGHT FOREARM  Final   Special Requests  Final    BOTTLES DRAWN AEROBIC AND ANAEROBIC Blood Culture adequate volume   Culture   Final    NO GROWTH 4 DAYS Performed at [facility], [place] [address], [place], [place] [address]    Report Status PENDING  Incomplete  Urine Culture     Status: None   Collection Time: 04/19/20 11:05 AM   Specimen: Urine, Random  Result Value Ref Range Status   Specimen Description URINE, RANDOM  Final   Special Requests NONE  Final   Culture   Final    NO GROWTH Performed at [facility], [place] [address], [place], [place] [address]    Report Status 04/20/2020 FINAL  Final  C Difficile Quick Screen w PCR reflex     Status: None   Collection Time: 04/19/20  2:37 PM   Specimen: STOOL  Result Value Ref Range Status   C Diff antigen NEGATIVE NEGATIVE Final   C Diff toxin NEGATIVE NEGATIVE Final   C Diff interpretation No C. difficile detected.  Final    Comment: Performed at [facility], [place] [address] [place], [place] [address]  Surgical pcr screen     Status: None   Collection Time: 04/20/20  3:23 AM   Specimen: Nasal Mucosa; Nasal Swab  Result Value Ref Range Status   MRSA, PCR NEGATIVE NEGATIVE Final   Staphylococcus aureus NEGATIVE NEGATIVE Final    Comment: (NOTE) The Xpert SA Assay (FDA approved for NASAL specimens in patients 15 years of age and older), is one component of a comprehensive surveillance program. It is not intended to diagnose infection nor to guide or monitor treatment. Performed at [facility], [place] [address], [place], [place] [address]   Aerobic/Anaerobic Culture (surgical/deep wound)     Status: None (Preliminary result)   Collection Time: 04/21/20  1:16 PM   Specimen: Wound; Abscess  Result Value Ref Range Status   Specimen Description WOUND UTERUS  Final   Special Requests NONE  Final   Gram Stain   Final    RARE WBC PRESENT, PREDOMINANTLY PMN RARE GRAM POSITIVE COCCI    Culture   Final    NO GROWTH 2 DAYS Performed at [facility] [address], [place], [place] [address]    Report Status PENDING  Incomplete     Terri Piedra, [facility]  04/23/2020  12:35 PM

## 2020-04-23 NOTE — Progress Notes (Signed)
Patient ambulated in hall and was able to pass gas rectally several times.  No need to administer suppository and patient declined.  Patient is adequate for discharge.

## 2020-04-23 NOTE — Progress Notes (Signed)
Discharge paperwork reviewed with patient and family at bedside. All verbalized understanding and declined further education.  Patient transported off unit via wheelchair and volunteer services for discharge.

## 2020-04-24 NOTE — Discharge Summary (Addendum)
Physician Discharge Summary  Patient ID: Sandra Gregory MRN: [id number] DOB/AGE: 11/24/1985 34 y.o.  Admit date: 04/17/2020 Discharge date: 04/24/2020  Admission Diagnoses: fever of unknown origin. Uterine fibroids; anemia due to chronic blood loss; leukocytosis; tachycardia; sepsis    Discharge Diagnoses:  Principal Problem:   Fever Active Problems:   Fibroids   Tachycardia   Iron deficiency anemia due to chronic blood loss   Anemia  Discharged Condition: good  Hospital Course: This is a 34 year old female who states she had a miscarriage May 25. She states since then she cannot stand for long time because of dizziness. She says every time she stands she gets dizzy. She states she always feels presyncopal. At one point she had complete loss of appetite but that has since resolved. As result she had loss of weight loss. She states she has been weak, to the point where after shower she has to lay down. She denies any pain anywhere including abdominal. She does have shortness of breath but this is mainly associated with activity. She has a history of anemia and has been on iron tablets most of her adult life.  Today she came in for preop testing for myomectomy. Vitals show tachycardia up to 140, temperature of 102.4. She was sent to the ER. She met sepsis criteria. With a white blood count of 21 and hemoglobin of 6.7.  Patient has a history of metromenorrhagia. She has several large fibroids with up to12 cm long. She uses 8-hour sanitary napkins and goes through approximately 4 a day. The patient states she has not been ill over the past few days, no fevers or chills, not asymptomatic with illness.  Pt was admitted and started on broad spectrum antibiotics. She had cx taken of the blood urine and stool all were negative. She was transfused 3 units of blood throughout her hospitalization. In spite of atbx her WBC contd to be elevated although her fever improved. Flagyl was added to her Zosyn  and her WBC improved somewhat. A CT of the abd/pelvis revealed a possible abscess of the central fibroid. A decision was made to proceed with a surgical hysterectomy in lieu of a myomectomy.  Patient had an uncomplicated surgery; for further details of this surgery, please refer to the operative note. Furthermore, the patient had an uncomplicated postoperative course.  Her IV atbx were stopped on POD #2. After surgery her WBC dropped and she reported feeling much better than preop. By time of discharge, her pain was controlled on oral pain medications; she was ambulating, voiding without difficulty, tolerating regular diet and passing flatus.  She was deemed stable for discharge to home.   She will be discharged on po atbx to complete a 10 day course for pelvic abscess.  Patient met criteria for SEPSIS on admission given SIRS criteria of WBC ct & fever from her pelvic abscess  Consults: ID cbc Significant Diagnostic Studies: labs: CBC, CMP, microbiology: blood culture: negative, urine culture: negative and wound culture: negative and radiology: CT scan: gas bubbles in central fibroid of large fibroid uterus.    Treatments: IV hydration, antibiotics: Zosyn, Unasyn, ceftriaxone and metronidazole, analgesia: acetaminophen w/ codeine, Dilaudid and Toradol and surgery: Total abdominal hysterectomy with bilateral salpingectomy  Discharge Exam: Blood pressure 95/62, pulse (!) 51, temperature 97.6 F (36.4 C), temperature source Oral, resp. rate 17, height 5' 7" (1.702 m), weight 81.8 kg, SpO2 100 %, unknown if currently breastfeeding. General appearance: alert and no distress Resp: clear to auscultation bilaterally Cardio: regular  rate and rhythm, S1, S2 normal, no murmur, click, rub or gallop GI: soft, non-tender; bowel sounds normal; no masses,  no organomegaly Pelvic: No bleeding  Incision: clean, dry and intact  Disposition: Discharge disposition: 01-Home or Self Care       Discharge  Instructions    Call MD for:  difficulty breathing, headache or visual disturbances   Complete by: As directed    Call MD for:  extreme fatigue   Complete by: As directed    Call MD for:  hives   Complete by: As directed    Call MD for:  persistant dizziness or light-headedness   Complete by: As directed    Call MD for:  persistant nausea and vomiting   Complete by: As directed    Call MD for:  redness, tenderness, or signs of infection (pain, swelling, redness, odor or green/yellow discharge around incision site)   Complete by: As directed    Call MD for:  severe uncontrolled pain   Complete by: As directed    Call MD for:  temperature >100.4   Complete by: As directed    Diet general   Complete by: As directed    Discharge wound care:   Complete by: As directed    Remove outer dressing in 5 days. Keep incision clean and dry.   Driving Restrictions   Complete by: As directed    NO driving for 2 weeks   Increase activity slowly   Complete by: As directed    Lifting restrictions   Complete by: As directed    No heavy lifting for 6 weeks   Sexual Activity Restrictions   Complete by: As directed    Nothing in vagina for 6 weeks     Allergies as of 04/23/2020   No Known Allergies     Medication List    STOP taking these medications   traMADol 50 MG tablet Commonly known as: ULTRAM     TAKE these medications   amoxicillin-clavulanate 875-125 MG tablet Commonly known as: AUGMENTIN Take 1 tablet by mouth 2 (two) times daily.   docusate sodium 100 MG capsule Commonly known as: COLACE Take 1 capsule (100 mg total) by mouth 2 (two) times daily.   ibuprofen 800 MG tablet Commonly known as: ADVIL Take 1 tablet (800 mg total) by mouth every 6 (six) hours.   iron polysaccharides 150 MG capsule Commonly known as: NIFEREX Take 1 capsule (150 mg total) by mouth daily.   metroNIDAZOLE 500 MG tablet Commonly known as: FLAGYL Take 1 tablet (500 mg total) by mouth 2 (two)  times daily.   oxyCODONE-acetaminophen 5-325 MG tablet Commonly known as: PERCOCET/ROXICET Take 1 tablet by mouth every 6 (six) hours as needed for moderate pain or severe pain.            Discharge Care Instructions  (From admission, onward)         Start     Ordered   04/23/20 0000  Discharge wound care:       Comments: Remove outer dressing in 5 days. Keep incision clean and dry.   04/23/20 0902          Follow-up Information    [facility] [place] In 2 weeks.   Specialty: Internal Medicine Contact information: [address], [address] [place] [address] [phone]              Signed: Carolyn Harraway-Smith 04/24/2020, 5:28 PM  

## 2020-04-26 LAB — AEROBIC/ANAEROBIC CULTURE W GRAM STAIN (SURGICAL/DEEP WOUND): Culture: NO GROWTH

## 2020-05-01 ENCOUNTER — Ambulatory Visit (INDEPENDENT_AMBULATORY_CARE_PROVIDER_SITE_OTHER): Payer: 59 | Admitting: Obstetrics & Gynecology

## 2020-05-01 ENCOUNTER — Other Ambulatory Visit: Payer: Self-pay

## 2020-05-01 ENCOUNTER — Encounter: Payer: Self-pay | Admitting: Obstetrics & Gynecology

## 2020-05-01 VITALS — BP 115/76 | HR 83 | Ht 67.0 in | Wt 169.0 lb

## 2020-05-01 DIAGNOSIS — E611 Iron deficiency: Secondary | ICD-10-CM

## 2020-05-01 DIAGNOSIS — D500 Iron deficiency anemia secondary to blood loss (chronic): Secondary | ICD-10-CM

## 2020-05-01 NOTE — Patient Instructions (Signed)
Abdominal Hysterectomy, Care After This sheet gives you information about how to care for yourself after your procedure. Your doctor may also give you more specific instructions. If you have problems or questions, contact your doctor. Follow these instructions at home: Bathing  Do not take baths, swim, or use a hot tub until your doctor says it is okay. Ask your doctor if you can take showers. You may only be allowed to take sponge baths for bathing.  Keep the bandage (dressing) dry until your doctor says it can be taken off. Surgical cut (incision) care      Follow instructions from your doctor about how to take care of your cut from surgery. Make sure you: ? Wash your hands with soap and water before you change your bandage (dressing). If you cannot use soap and water, use hand sanitizer. ? Change your bandage as told by your doctor. ? Leave stitches (sutures), skin glue, or skin tape (adhesive) strips in place. They may need to stay in place for 2 weeks or longer. If tape strips get loose and curl up, you may trim the loose edges. Do not remove tape strips completely unless your doctor says it is okay.  Check your surgical cut area every day for signs of infection. Check for: ? Redness, swelling, or pain. ? Fluid or blood. ? Warmth. ? Pus or a bad smell. Activity  Do gentle, daily exercise as told by your doctor. You may be told to take short walks every day and go farther each time.  Do not lift anything that is heavier than 10 lb (4.5 kg), or the limit that your doctor tells you, until he or she says that it is safe.  Do not drive or use heavy machinery while taking prescription pain medicine.  Do not drive for 24 hours if you were given a medicine to help you relax (sedative).  Follow your doctor's advice about exercise, driving, and general activities. Ask your doctor what activities are safe for you. Lifestyle   Do not douche, use tampons, or have sex for at least 6 weeks  or as told by your doctor.  Do not drink alcohol until your doctor says it is okay.  Drink enough fluid to keep your pee (urine) clear or pale yellow.  Try to have someone at home with you for the first 1-2 weeks to help.  Do not use any products that contain nicotine or tobacco, such as cigarettes and e-cigarettes. These can slow down healing. If you need help quitting, ask your doctor. General instructions  Take over-the-counter and prescription medicines only as told by your doctor.  Do not take aspirin or ibuprofen. These medicines can cause bleeding.  To prevent or treat constipation while you are taking prescription pain medicine, your doctor may suggest that you: ? Drink enough fluid to keep your urine clear or pale yellow. ? Take over-the-counter or prescription medicines. ? Eat foods that are high in fiber, such as:  Fresh fruits and vegetables.  Whole grains.  Beans. ? Limit foods that are high in fat and processed sugars, such as fried and sweet foods.  Keep all follow-up visits as told by your doctor. This is important. Contact a doctor if:  You have chills or fever.  You have redness, swelling, or pain around your cut.  You have fluid or blood coming from your cut.  Your cut feels warm to the touch.  You have pus or a bad smell coming from your cut.    Your cut breaks open.  You feel dizzy or light-headed.  You have pain or bleeding when you pee.  You keep having watery poop (diarrhea).  You keep feeling sick to your stomach (nauseous) or keep throwing up (vomiting).  You have unusual fluid (discharge) coming from your vagina.  You have a rash.  You have a reaction to your medicine.  Your pain medicine does not help. Get help right away if:  You have a fever and your symptoms get worse all of a sudden.  You have very bad belly (abdominal) pain.  You are short of breath.  You pass out (faint).  You have pain, swelling, or redness of your  leg.  You bleed a lot from your vagina and notice clumps of blood (clots). Summary  Do not take baths, swim, or use a hot tub until your doctor says it is okay. Ask your doctor if you can take showers. You may only be allowed to take sponge baths for bathing.  Follow your doctor's advice about exercise, driving, and general activities. Ask your doctor what activities are safe for you.  Do not lift anything that is heavier than 10 lb (4.5 kg), or the limit that your doctor tells you, until he or she says that it is safe.  Try to have someone at home with you for the first 1-2 weeks to help. This information is not intended to replace advice given to you by your health care provider. Make sure you discuss any questions you have with your health care provider. Document Revised: 11/08/2018 Document Reviewed: 08/24/2016 Elsevier Patient Education  2020 [employer]  

## 2020-05-01 NOTE — Progress Notes (Signed)
History:  34 y.o.LMP here today for 2 week post op check.Pt is s/p Abd hysterectomy with bilateral salpingectomy on 04/21/2020 for uterine fibroids and abscess.  Pt reports that she is doing well. She is eating and passing stols without difficulty.   She denies fever or chills. She has completed all of her atbx.   The following portions of the patient's history were reviewed and updated as appropriate: allergies, current medications, past family history, past medical history, past social history, past surgical history and problem list.  Review of Systems:  Pertinent items are noted in HPI.    Objective:  Physical Exam BP 115/76   Pulse 83   Ht 5\' 7"  (1.702 m)   Wt 169 lb (76.7 kg)   LMP 03/08/2020 Comment: pt stated having miscarriage on 02/11/20, also stated having her last period around 03/08/20  BMI 26.47 kg/m   CONSTITUTIONAL: Well-developed, well-nourished female in no acute distress.  HENT:  Normocephalic, atraumatic EYES: Conjunctivae and EOM are normal. No scleral icterus.  NECK: Normal range of motion SKIN: Skin is warm and dry. No rash noted. Not diaphoretic.No pallor. [place]: Alert and oriented to person, place, and time. Normal coordination.  Abd: Soft, nontender and nondistended; incision well healed.  Pelvic: deferred  Labs and Imaging Surg path 04/21/2020 UTERUS, CERVIX, AND BILATERAL FALLOPIAN TUBES, HYSTERECTOMY:  Uterus:  - Proliferative pattern endometrium with progestin effect  - Leiomyomata with areas of acute inflammation/abscess (9 cm; largest)  - Serosa with fibrous adhesions  - No malignancy identified   Cervix:  - Benign cervix with squamous metaplasia  - No dysplasia or malignancy identified   Fallopian tubes, bilateral:  - Benign fallopian tubes  - No malignancy identified    Assessment & Plan:  2 week post op check following TAH with bilateral salpingectomy.   Doing well  Reviewed her surg path.   Reviewed post op instructions and  activities  Gradual increase in activities  F/u in 4 weeks or sooner prn  Reviewed nothing in vagina for 6 weeks post op  All questions answered.   Karston Hyland L. Harraway-Smith, M.D., Cherlynn June

## 2020-06-05 ENCOUNTER — Ambulatory Visit (INDEPENDENT_AMBULATORY_CARE_PROVIDER_SITE_OTHER): Payer: 59 | Admitting: Obstetrics & Gynecology

## 2020-06-05 ENCOUNTER — Other Ambulatory Visit: Payer: Self-pay

## 2020-06-05 VITALS — BP 108/73 | HR 77 | Ht 67.0 in

## 2020-06-05 DIAGNOSIS — Z9889 Other specified postprocedural states: Secondary | ICD-10-CM

## 2020-06-05 NOTE — Progress Notes (Signed)
Six week post op from hysterectomy. Kathrene Alu RN

## 2020-06-16 ENCOUNTER — Encounter: Payer: Self-pay | Admitting: Obstetrics & Gynecology

## 2020-06-16 NOTE — Progress Notes (Signed)
History:  34 y.o.LMP here today for 2 week post op check.Pt is s/p Abd hysterectomy with bilateral salpingectomy and cystoscopy on 04/21/2020.  Pt reports that she is doing well. She is eating and passing stols without difficulty.   Good pain control.   The following portions of the patient's history were reviewed and updated as appropriate: allergies, current medications, past family history, past medical history, past social history, past surgical history and problem list.  Review of Systems:  Pertinent items are noted in HPI.    Objective:  Physical Exam BP 108/73   Pulse 77   Ht 5\' 7"  (1.702 m)   LMP 03/08/2020 Comment: pt stated having miscarriage on 02/11/20, also stated having her last period around 03/08/20  BMI 26.47 kg/m   CONSTITUTIONAL: Well-developed, well-nourished female in no acute distress.  HENT:  Normocephalic, atraumatic EYES: Conjunctivae and EOM are normal. No scleral icterus.  NECK: Normal range of motion SKIN: Skin is warm and dry. No rash noted. Not diaphoretic.No pallor. [place]: Alert and oriented to person, place, and time. Normal coordination.  Abd: Soft, nontender and nondistended; her port sites are healing well. .  Pelvic: cuff well healed. No masses. Noted.   Labs and Imaging Surg path 04/21/2020 FINAL MICROSCOPIC DIAGNOSIS:   A. UTERUS, CERVIX, AND BILATERAL FALLOPIAN TUBES, HYSTERECTOMY:  Uterus:  - Proliferative pattern endometrium with progestin effect  - Leiomyomata with areas of acute inflammation/abscess (9 cm; largest)  - Serosa with fibrous adhesions  - No malignancy identified   Cervix:  - Benign cervix with squamous metaplasia  - No dysplasia or malignancy identified   Fallopian tubes, bilateral:  - Benign fallopian tubes  - No malignancy identified    Assessment & Plan:  6 week post op check following Abd hyst with bilateral salpingectomy and cystoscopy  .   Doing well  Reviewed her surg path.   Reviewed post op  instructions and activities  Gradual increase in activities  F/u in 3 months 4 weeks or sooner prn  Reviewed no intercourse for 8 weeks post op  All questions answered.   Rosalyn Archambault L. Harraway-Smith, M.D., Cherlynn June

## 2020-06-24 ENCOUNTER — Encounter: Payer: 59 | Admitting: Obstetrics & Gynecology

## 2021-08-25 ENCOUNTER — Encounter: Payer: Self-pay | Admitting: Obstetrics & Gynecology

## 2021-08-25 ENCOUNTER — Other Ambulatory Visit: Payer: Self-pay

## 2021-08-25 ENCOUNTER — Ambulatory Visit (INDEPENDENT_AMBULATORY_CARE_PROVIDER_SITE_OTHER): Payer: 59 | Admitting: Obstetrics & Gynecology

## 2021-08-25 VITALS — BP 111/69 | HR 74 | Wt 200.0 lb

## 2021-08-25 DIAGNOSIS — R102 Pelvic and perineal pain: Secondary | ICD-10-CM

## 2021-08-25 NOTE — Progress Notes (Signed)
History:  35 y.o. G2P1011 here today for c/o pain in the lower pelvis on the left side.  She reports that her pain is not resolved but, is not as severe as when it started. Pt had a hyst 04/2020.   The following portions of the patient's history were reviewed and updated as appropriate: allergies, current medications, past family history, past medical history, past social history, past surgical history and problem list.  Review of Systems:  Pertinent items are noted in HPI.    Objective:  Physical Exam Blood pressure 111/69, pulse 74, weight 200 lb (90.7 kg), last menstrual period 03/08/2020, unknown if currently breastfeeding.  CONSTITUTIONAL: Well-developed, well-nourished female in no acute distress.  HENT:  Normocephalic, atraumatic EYES: Conjunctivae and EOM are normal. No scleral icterus.  NECK: Normal range of motion SKIN: Skin is warm and dry. No rash noted. Not diaphoretic.No pallor. [place]: Alert and oriented to person, place, and time. Normal coordination.  Abd: Soft, nondistended. There is some tenderness in the lower abd. There is no rebound or guarding.  Pelvic: Normal appearing external genitalia; normal appearing vaginal mucosa and cervix.  Normal discharge.  Small uterus, no other palpable masses, no uterine or adnexal tenderness   Assessment & Plan:  Diagnoses and all orders for this visit:  Pelvic pain in female   NSAIDS prn  F/u prn  Total face-to-face time with patient, review of chart, discussion with consultant and coordination of care was 29min.    Aveyah Greenwood L. Harraway-Smith, M.D., Cherlynn June

## 2021-08-25 NOTE — Progress Notes (Signed)
Patient complaining of ovarian pain - left side. Kathrene Alu RN

## 2021-09-03 ENCOUNTER — Encounter: Payer: Self-pay | Admitting: Obstetrics & Gynecology

## 2022-01-17 IMAGING — MR MR PELVIS W/O CM
9 of 11 series · 39 of 48 positions shown · non-contrast
Comparison: Ultrasound 12/24/2019

CLINICAL DATA: Fifteen weeks pregnant with right lower quadrant
abdominal pain.

EXAM:
MRI ABDOMEN AND PELVIS WITHOUT CONTRAST
TECHNIQUE: Multiplanar multisequence MR imaging of the abdomen and pelvis was
performed. No intravenous contrast was administered.

[Series 9: ax haste_comp · axial · 5.0mm · 1.19mm/px · z∈[-309,+188]mm · 8 of 84 slices shown]
[im 1/84]
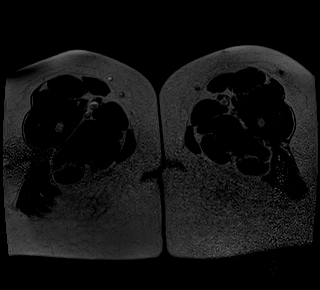
[im 12/84]
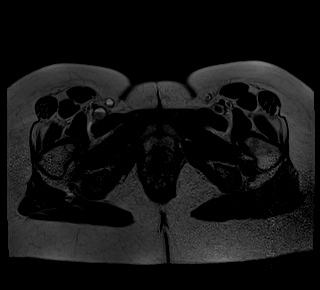
[im 24/84]
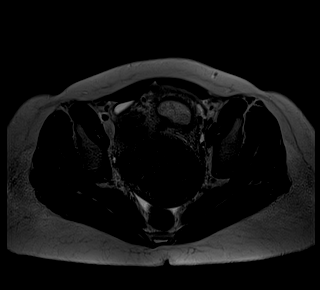
[im 36/84]
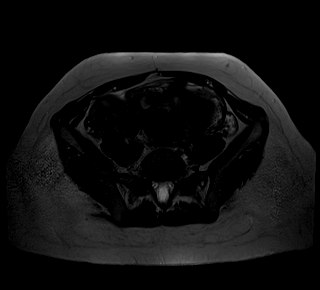
[im 48/84]
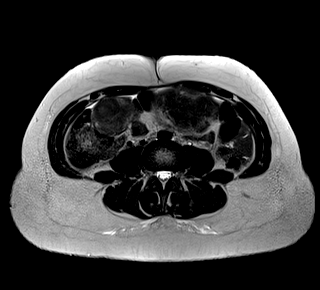
[im 60/84]
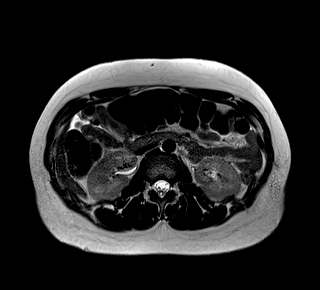
[im 72/84]
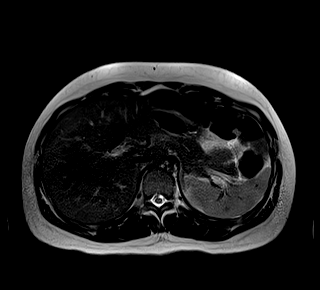
[im 84/84]
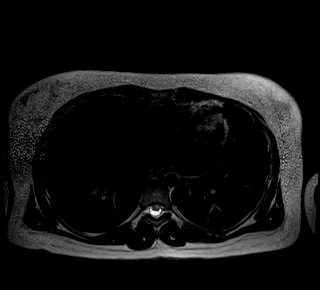

[Series 11: cor haste fs · coronal · 6.0mm · 1.38mm/px · 2 of 30 slices shown]
[im 1/30]
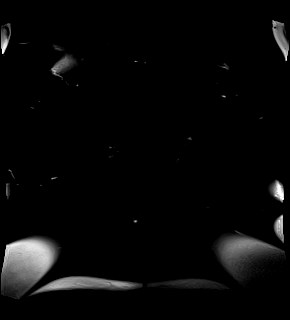
[im 30/30]
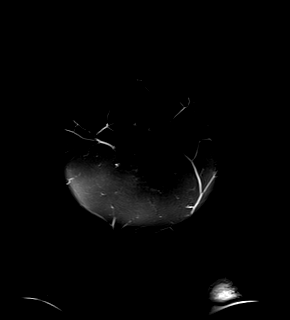

[Series 12: bSSFP · coronal · 6.0mm · 0.83mm/px · 2 of 30 slices shown (1 of 3)]
[im 1/30]
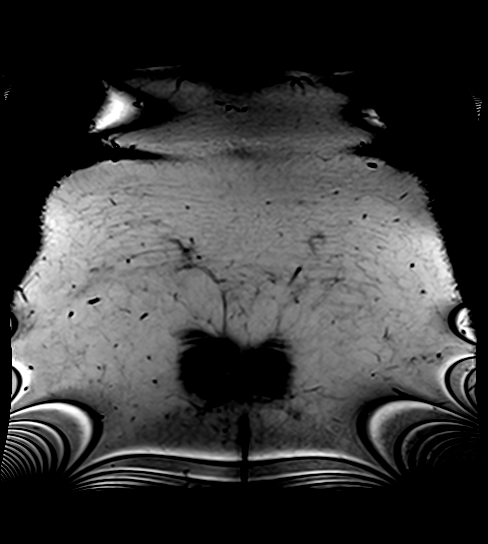
[im 30/30]
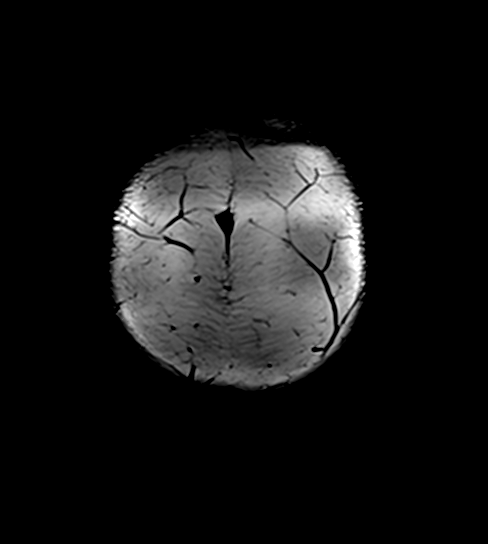

[Series 19: T2 fat-sat · axial · 5.0mm · 1.19mm/px · z∈[-309,+188]mm · 7 of 84 slices shown]
[im 1/84]
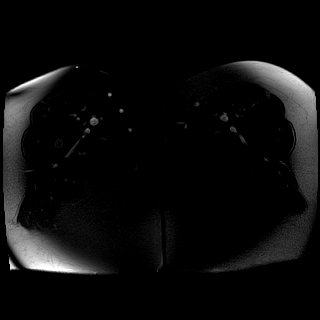
[im 14/84]
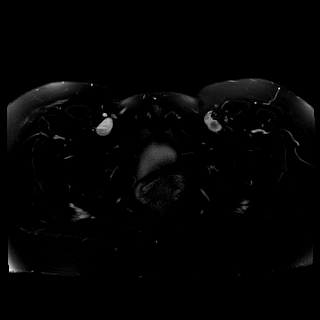
[im 28/84]
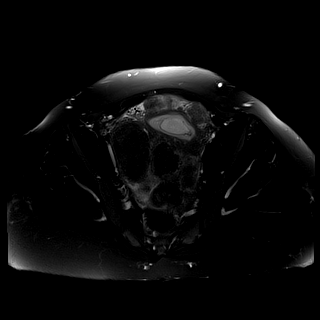
[im 42/84]
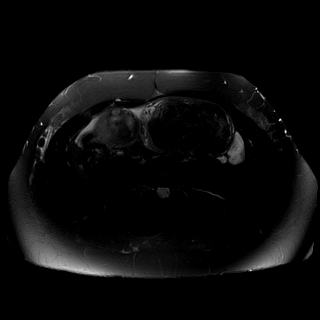
[im 56/84]
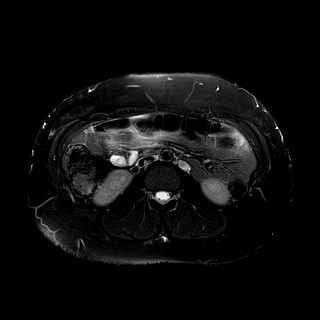
[im 70/84]
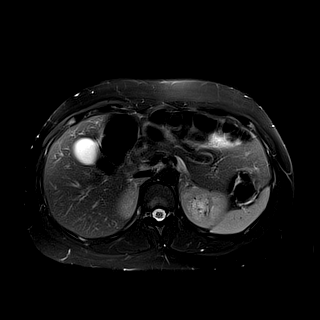
[im 84/84]
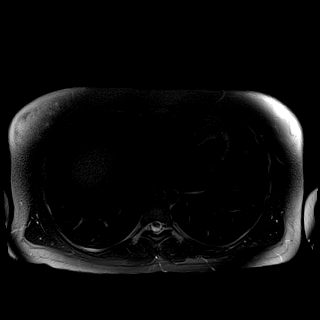

[Series 20: cor haste · coronal · 6.0mm · 1.25mm/px · 2 of 30 slices shown]
[im 1/30]
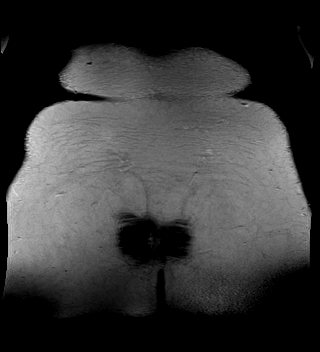
[im 30/30]
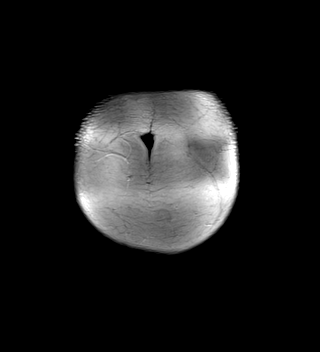

[Series 23: bSSFP · axial · 5.0mm · 0.74mm/px · z∈[-309,-63]mm · 3 of 42 slices shown (2 of 3)]
[im 1/42]
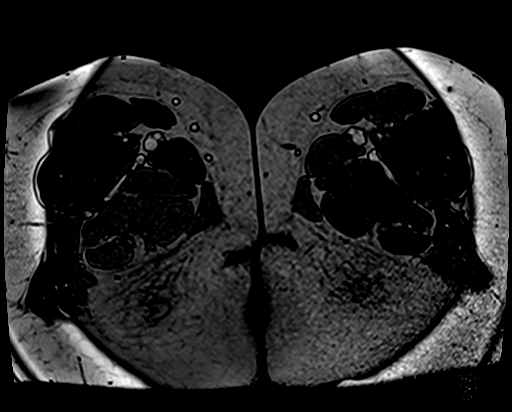
[im 21/42]
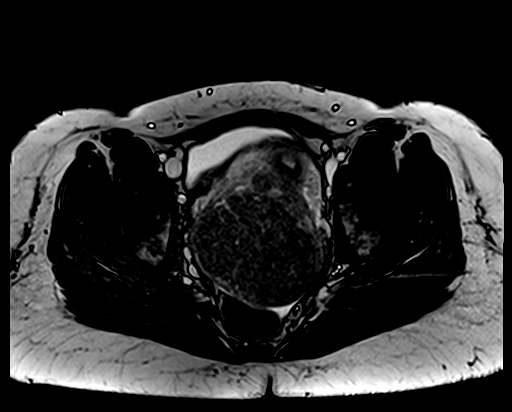
[im 42/42]
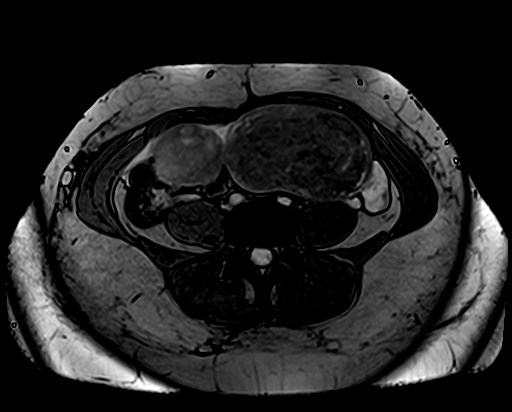

[Series 23: bSSFP · axial · 5.0mm · 0.66mm/px · z∈[-58,+188]mm · 3 of 42 slices shown (3 of 3)]
[im 1/42]
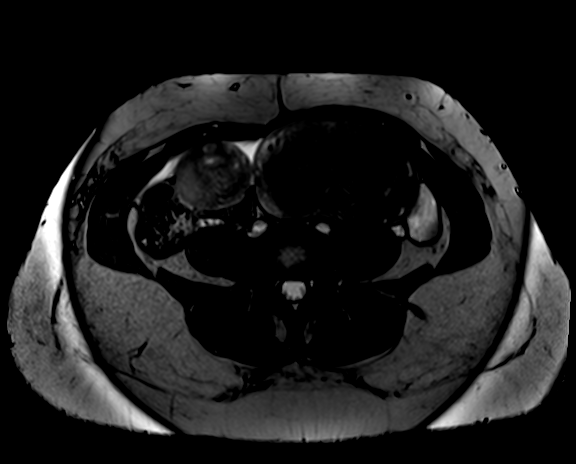
[im 21/42]
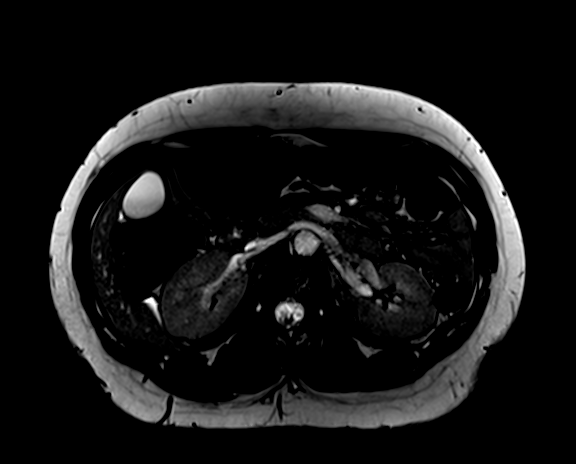
[im 42/42]
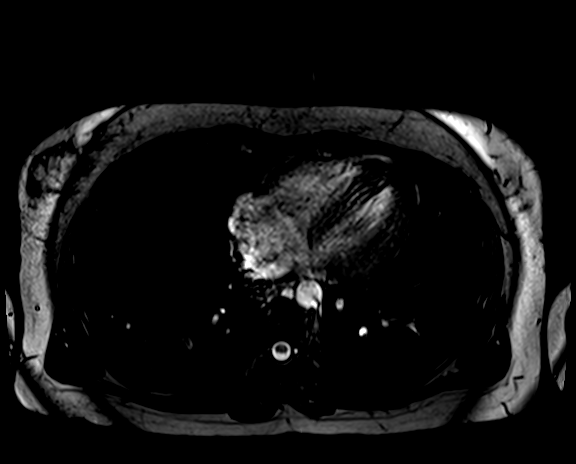

[Series 26: T1 · axial · 6.0mm · 1.48mm/px · z∈[-358,+165]mm · 6 of 74 slices shown]
[im 1/74]
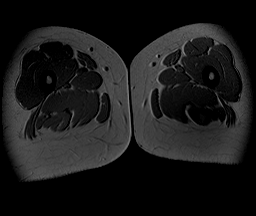
[im 15/74]
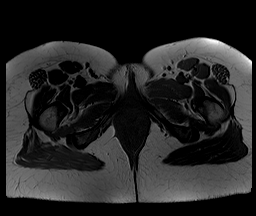
[im 30/74]
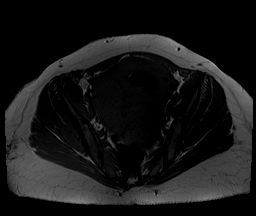
[im 44/74]
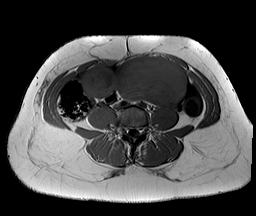
[im 59/74]
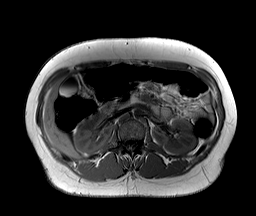
[im 74/74]
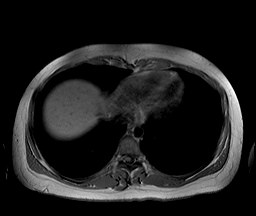

[Series 27: T1 dynamic · axial · 3.0mm · 1.38mm/px · z∈[-77,+160]mm · 6 of 80 slices shown]
[im 1/80]
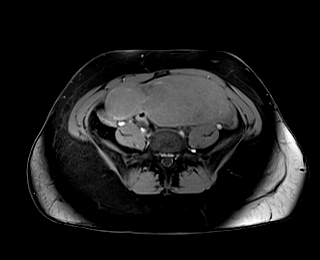
[im 16/80]
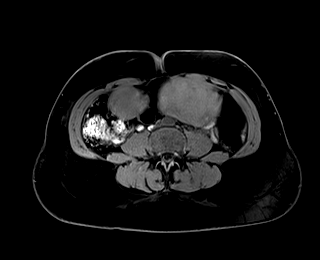
[im 32/80]
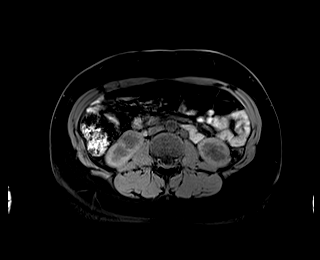
[im 48/80]
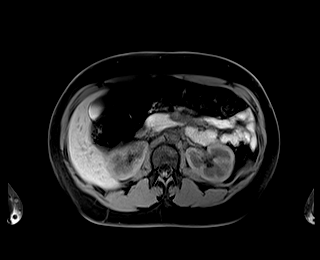
[im 64/80]
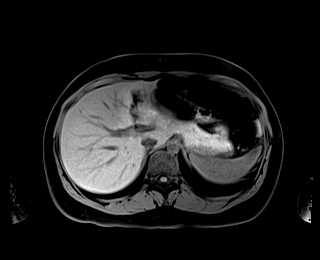
[im 80/80]
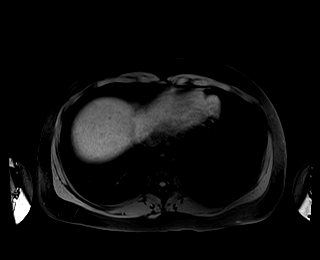

[39 of 48 positions shown; findings below may reference images not displayed]

FINDINGS: COMBINED FINDINGS FOR BOTH MR ABDOMEN AND PELVIS

Lower chest: The visualized lung bases are clear. No pleural or
pericardial effusion.

Hepatobiliary: No worrisome hepatic lesions or intrahepatic biliary
dilatation. A small lesion in segment 7 is likely a small cyst or
hemangioma. The gallbladder is unremarkable. No gallstones or
pericholecystic fluid. Normal caliber and course of the common bile
duct.

Pancreas:  No mass, inflammation or ductal dilatation.

Spleen:  Normal size. No focal lesions.

Adrenals/Urinary Tract: The adrenal glands and kidneys are
unremarkable. No worrisome renal lesions or hydronephrosis.

Stomach/Bowel: The stomach, duodenum, small bowel and colon are
grossly no. Moderate stool noted in the right colon. The appendix is
not identified for certain but no findings to suggest acute
appendicitis.

Vascular/Lymphatic: No pathologically enlarged lymph nodes
identified. No abdominal aortic aneurysm demonstrated.

Reproductive: Markedly enlarged fibroid uterus with numerous large
fibroids. There is a large complex degenerated exophytic/serosal
fibroid on the right side.

Both ovaries appear grossly normal. There is a 3 cm corpus luteum
cyst noted on the left.

Gravid uterus with small intrauterine gestational sac.

Other: Small amount of free pelvic fluid. No pelvic adenopathy or
inguinal adenopathy.

Musculoskeletal: No significant bony findings.
IMPRESSION: 1. The appendix is not identified for certain but no findings to
suggest acute appendicitis.
2. Markedly enlarged fibroid uterus with numerous large fibroids.
3. 3 cm corpus luteum cyst on the left.
4. Gravid uterus with intrauterine gestational sac.

## 2022-01-18 IMAGING — US US OB COMP LESS 14 WK
1 series · 15 of 28 positions shown · non-contrast
Comparison: None available.

CLINICAL DATA: Initial evaluation for acute right lower quadrant
pain. Early pregnancy.

EXAM:
OBSTETRIC <14 WK ULTRASOUND
TECHNIQUE: Transabdominal ultrasound was performed for evaluation of the
gestation as well as the maternal uterus and adnexal regions.

[Series 1: us ob comp less 14 wk · 48 acquisitions, 15 frames shown]
[im 1/48]
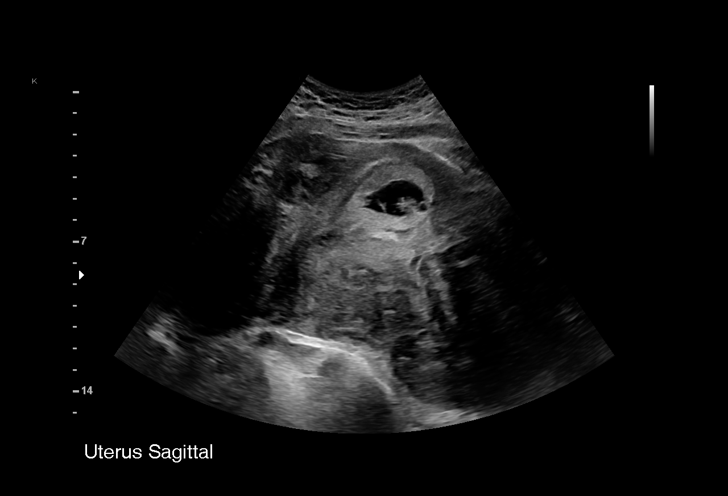
[im 4/48]
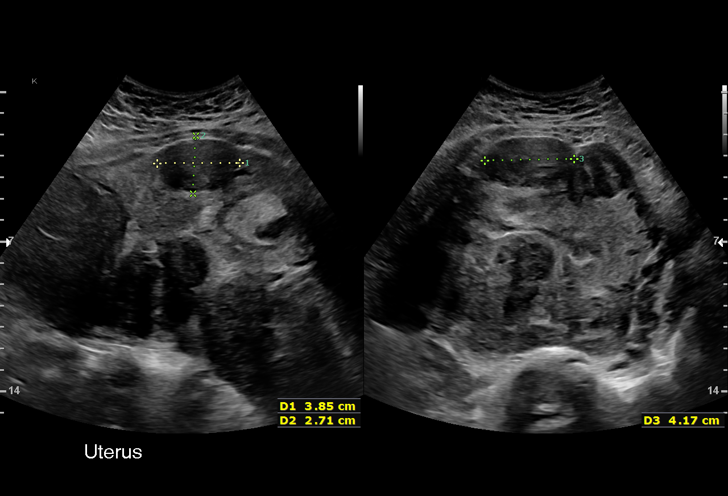
[im 7/48]
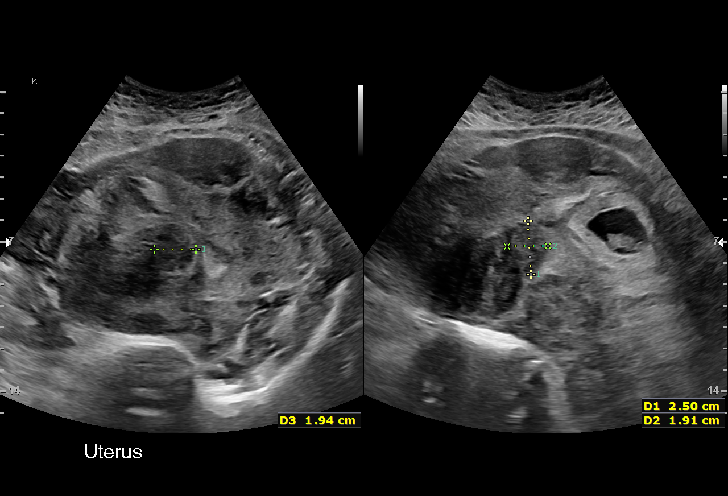
[im 11/48]
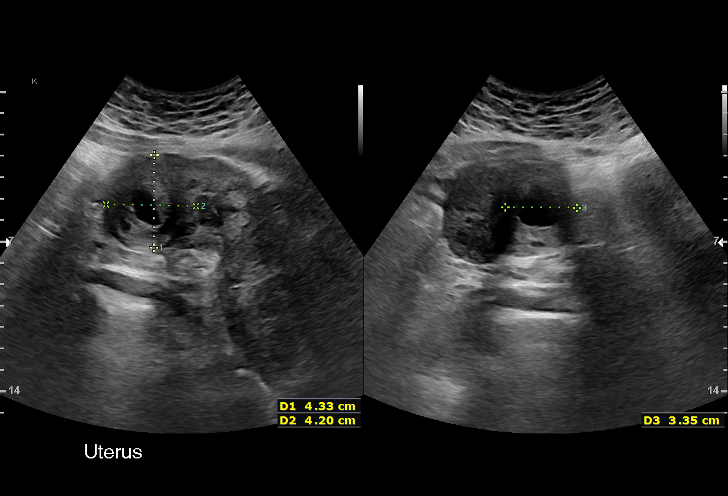
[im 14/48]
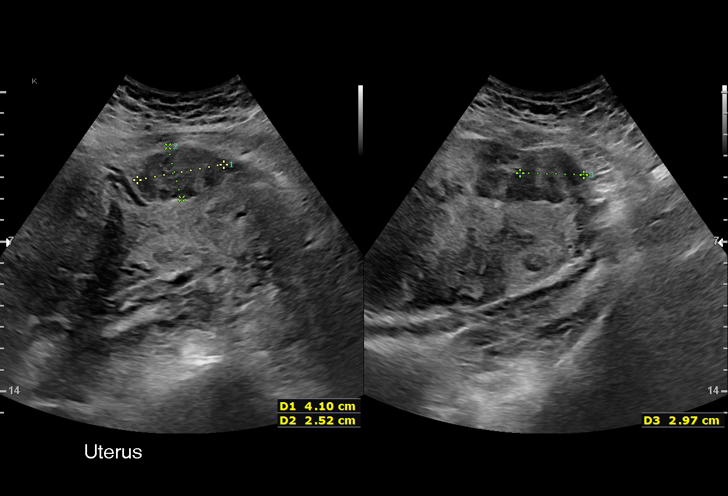
[im 18/48]
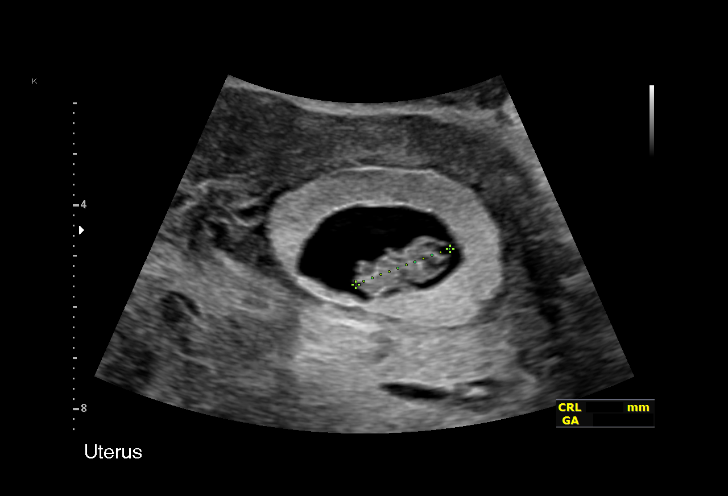
[im 21/48]
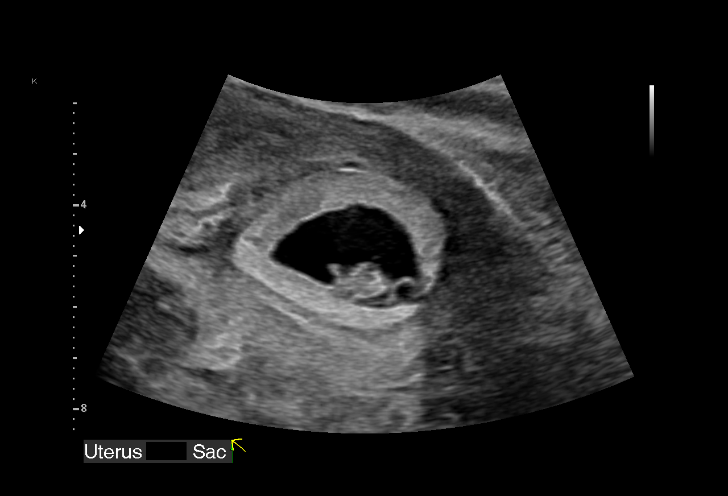
[im 25/48]
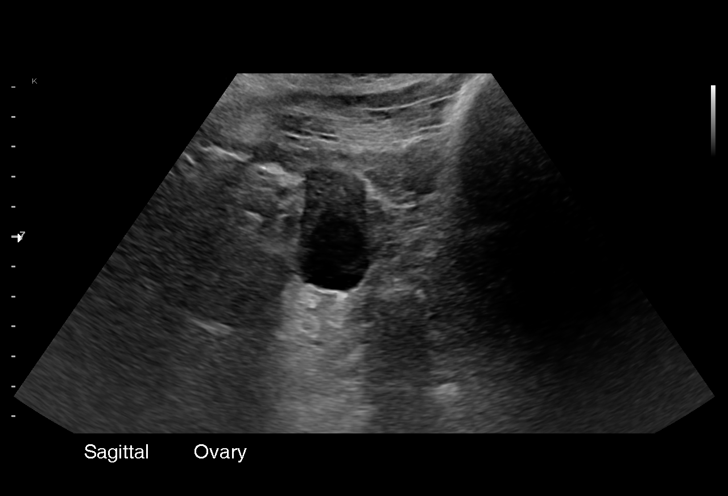
[im 27/48]
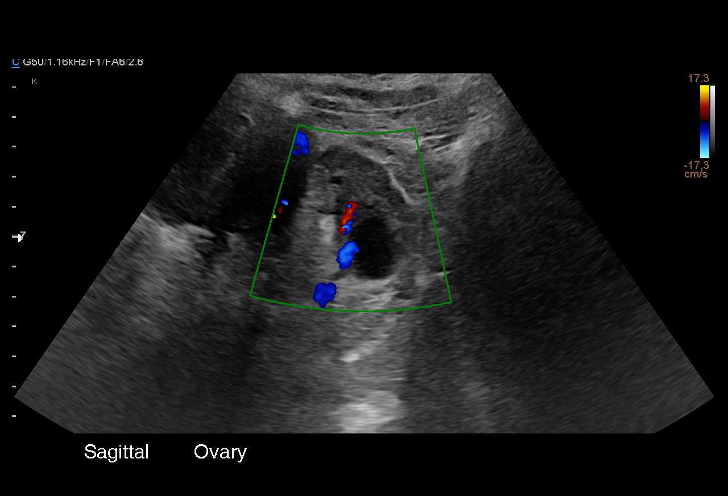
[im 30/48]
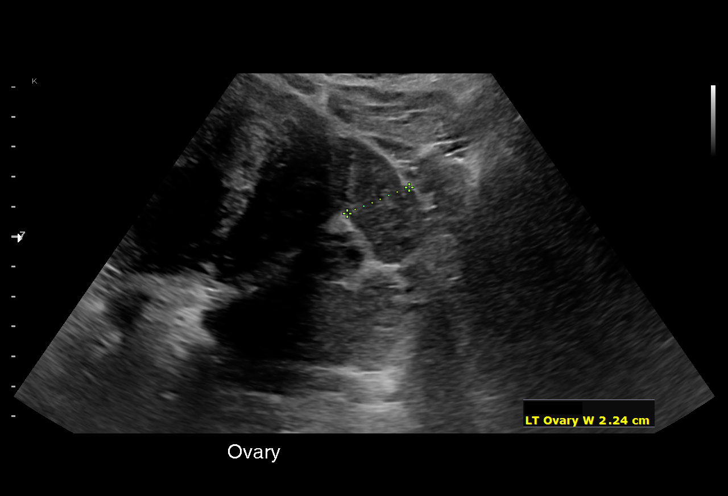
[im 34/48]
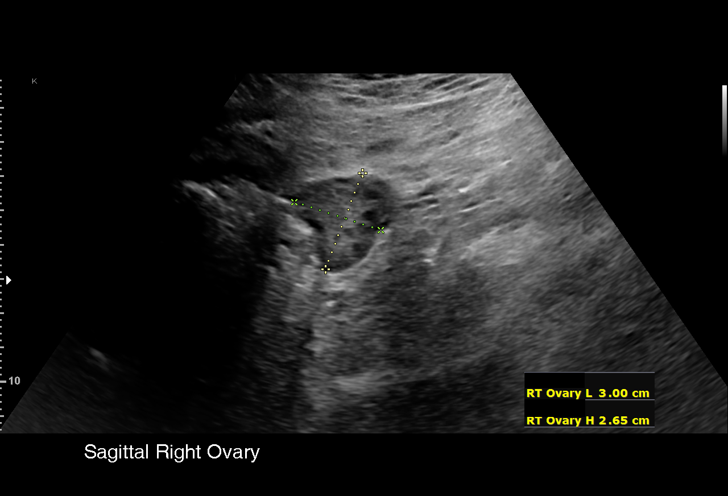
[im 37/48]
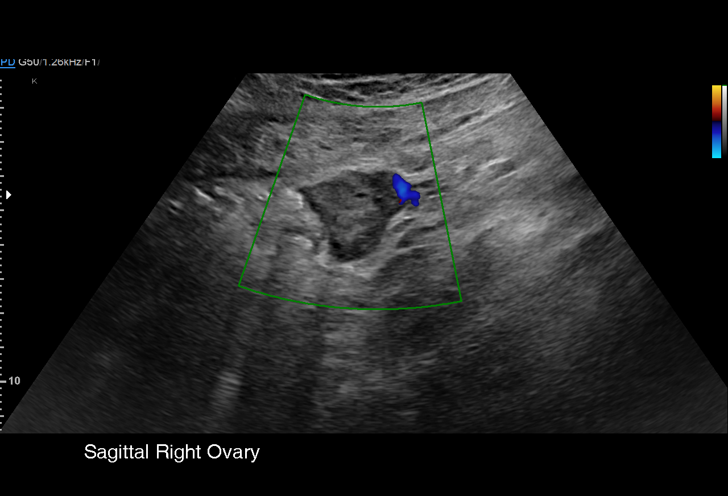
[im 41/48]
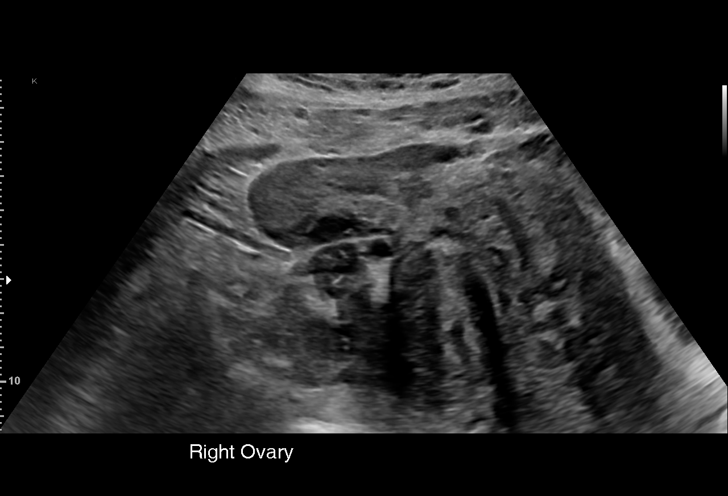
[im 44/48]
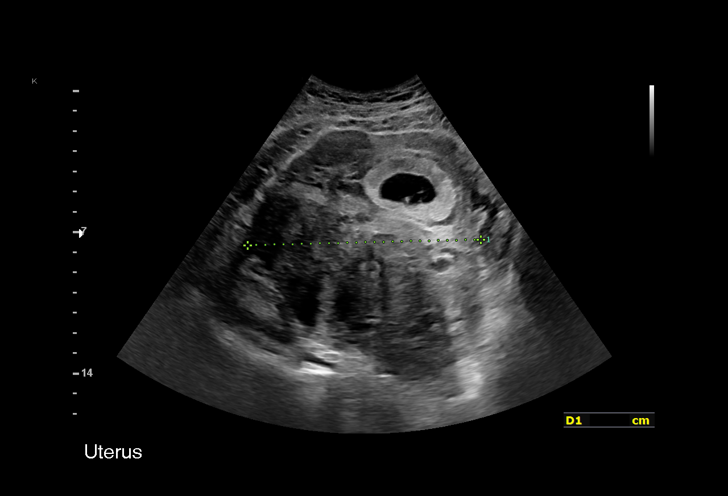
[im 48/48]
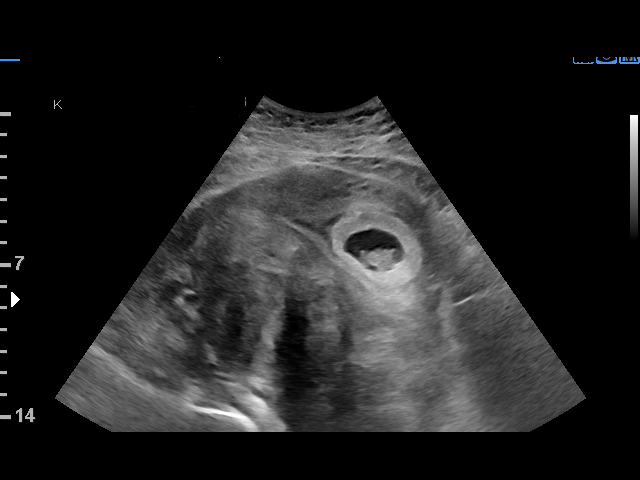

[15 of 28 positions shown; findings below may reference images not displayed]

FINDINGS: Intrauterine gestational sac: Single

Yolk sac:  Present

Embryo:  Present

Cardiac Activity: Present

Heart Rate: 174 bpm

CRL: 19.3 mm   8 w 3 d                  US EDC: 08/01/2020

Subchorionic hemorrhage:  None visualized.

Maternal uterus/adnexae: Ovaries are normal in appearance
bilaterally. 2.6 x 2.5 x 2.1 cm degenerating corpus luteal cyst
present within the left ovary. No free fluid within the pelvis.

Multiple scattered fibroid seen without the uterus. Largest of these
present at the lower uterine segment and measures 9.9 x 7.2 x
cm. Fundal fibroid measuring 7.9 x 7.1 x 8.8 cm present. 4.3 x 4.2 x
3.3 cm intramural fibroid present at the right uterus.
IMPRESSION: 1. Single viable intrauterine pregnancy as above, estimated
gestational age 8 weeks and 3 days by crown-rump length, with
ultrasound EDC of 08/01/2020. No complication.
2. Enlarged fibroid uterus as detailed above.
3. 2.6 cm degenerating left ovarian corpus luteal cyst.

## 2022-02-03 IMAGING — US US OB COMP LESS 14 WK
1 series · 15 of 28 positions shown · non-contrast
Comparison: 12/27/2019

CLINICAL DATA: Vaginal bleeding, positive urine pregnancy test

EXAM:
OBSTETRIC <14 WK ULTRASOUND
TECHNIQUE: Transabdominal ultrasound was performed for evaluation of the
gestation as well as the maternal uterus and adnexal regions.

[Series 1: us ob comp less 14 wk · 38 acquisitions, 15 frames shown]
[im 1/38]
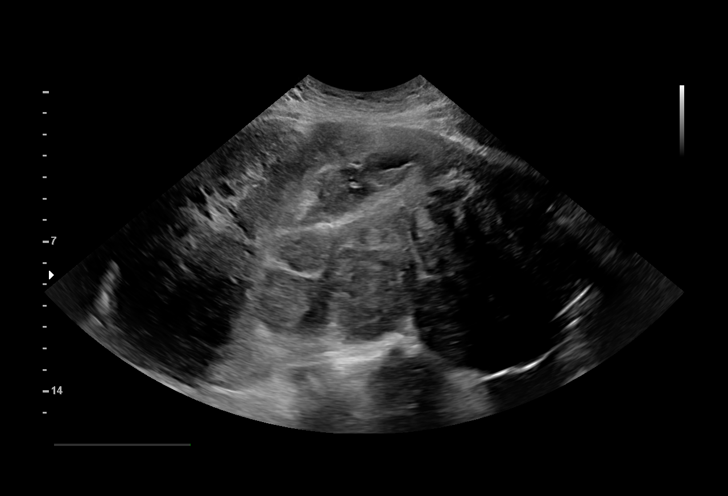
[im 3/38]
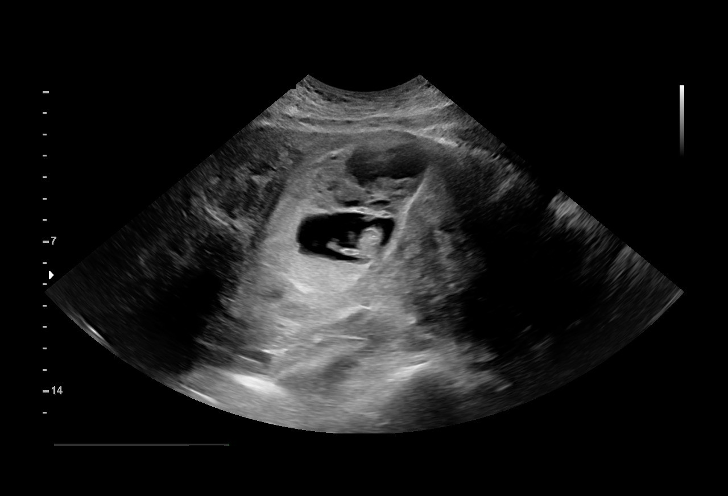
[im 6/38]
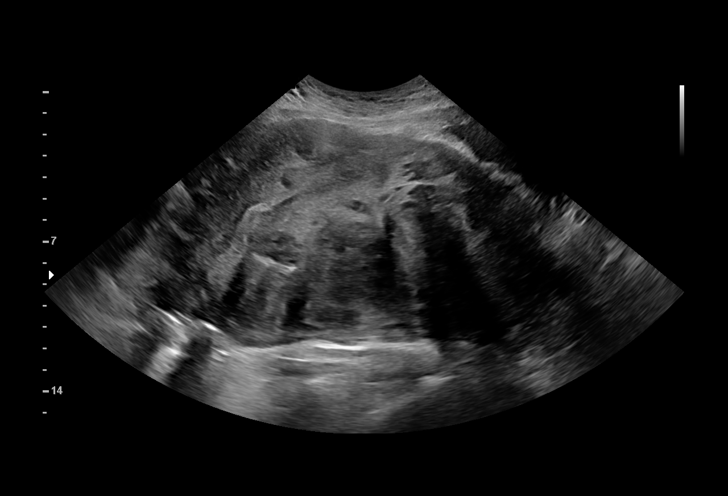
[im 9/38]
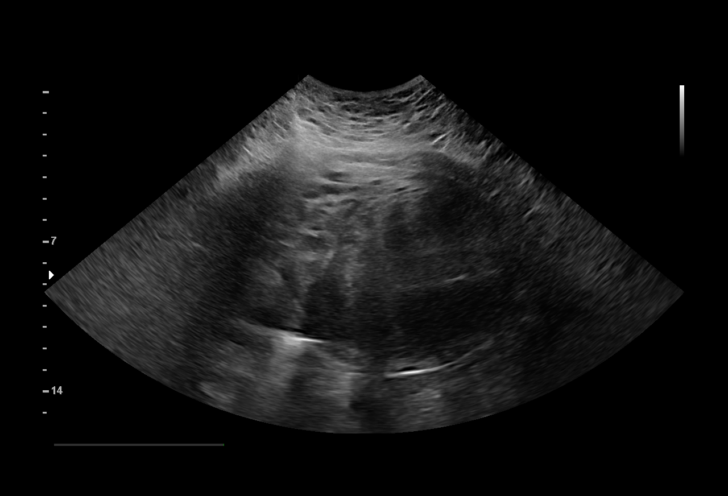
[im 11/38]
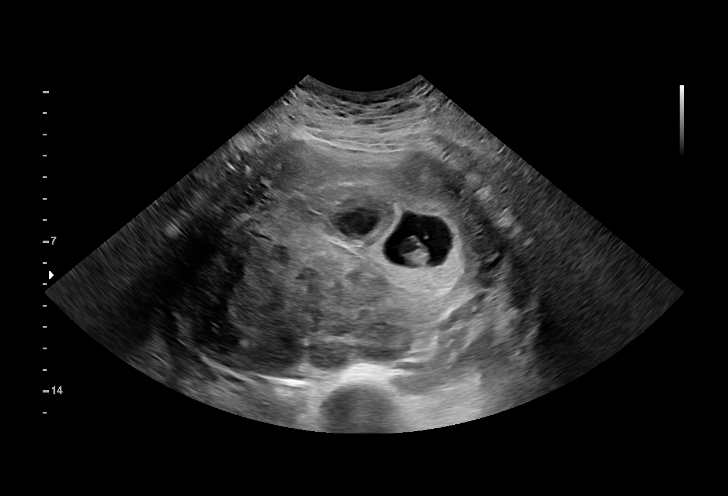
[im 14/38]
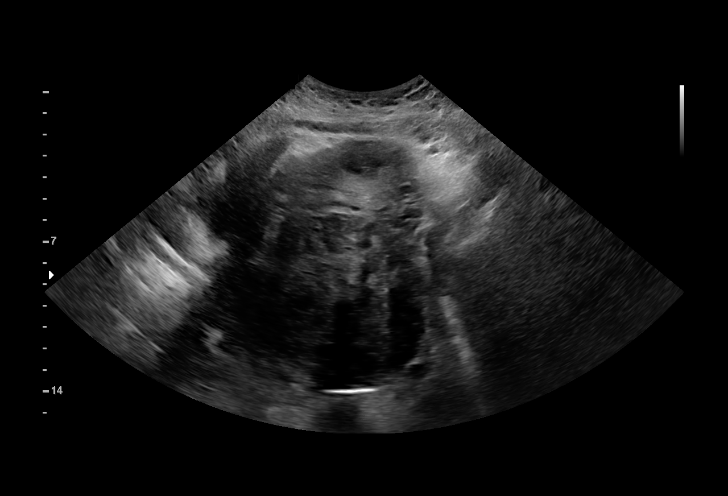
[im 17/38]
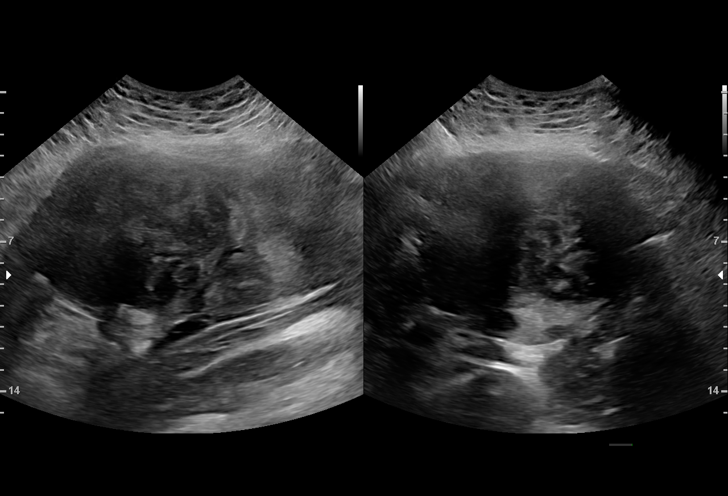
[im 20/38]
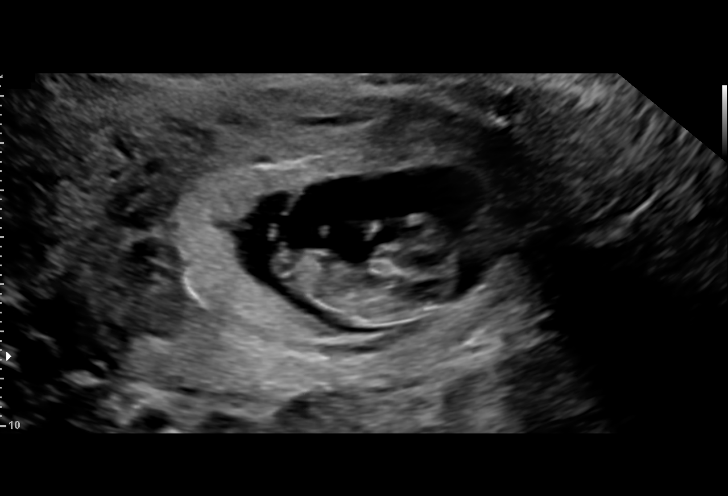
[im 21/38]
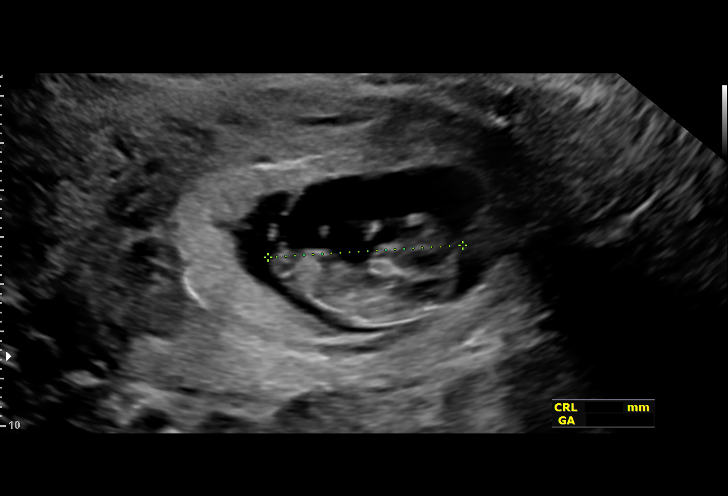
[im 24/38]
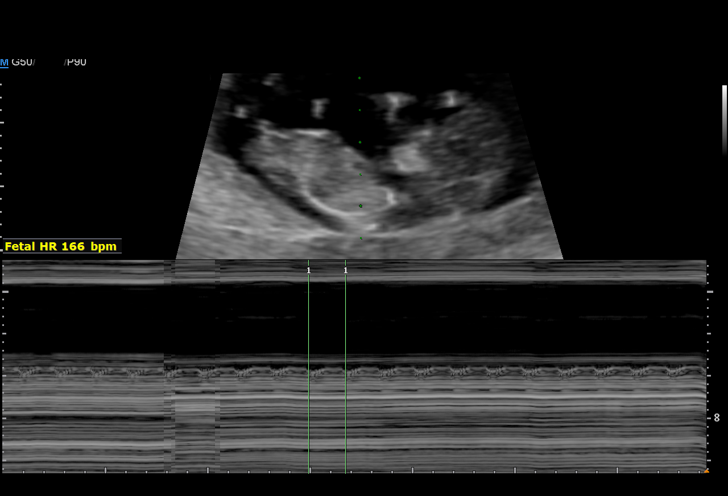
[im 27/38]
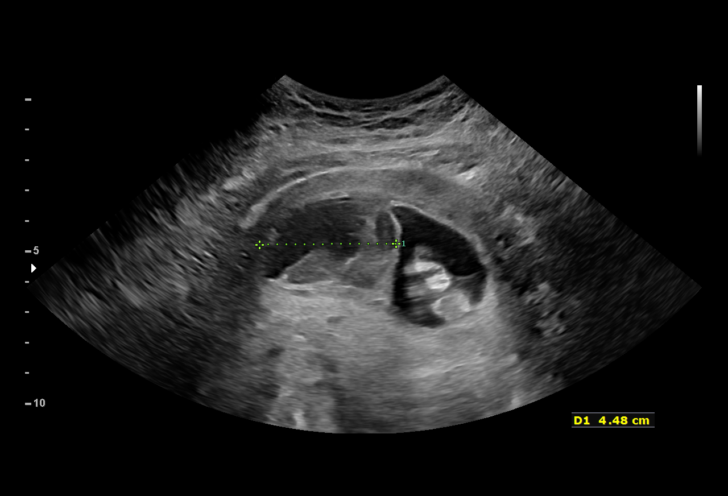
[im 29/38]
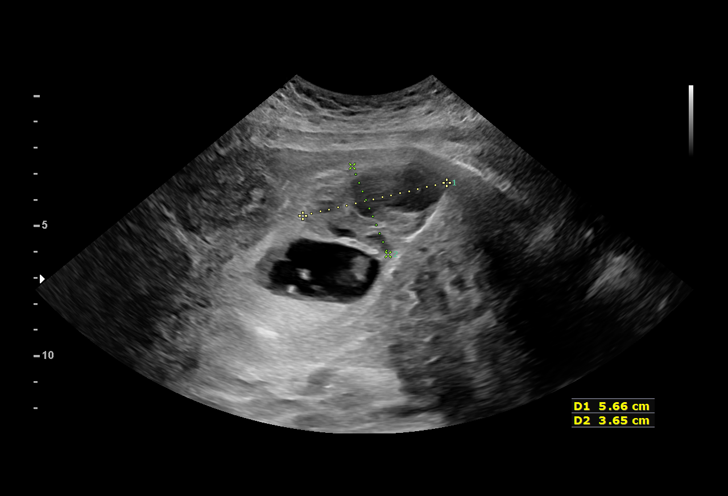
[im 32/38]
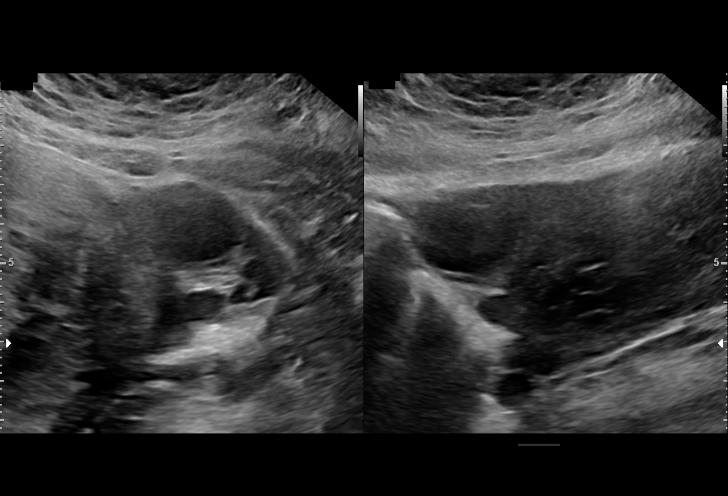
[im 35/38]
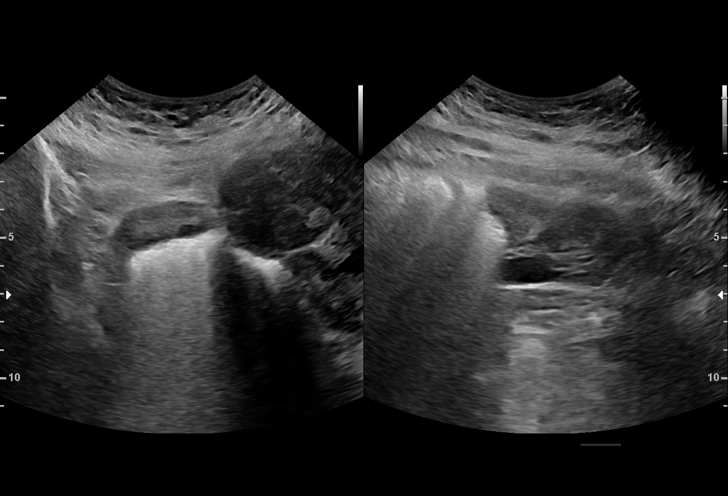
[im 38/38]
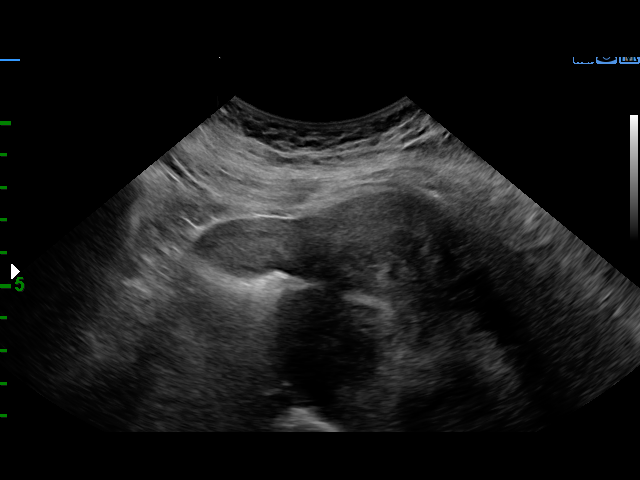

[15 of 28 positions shown; findings below may reference images not displayed]

FINDINGS: Intrauterine gestational sac: Single

Yolk sac:  Not Visualized.

Embryo:  Visualized.

Cardiac Activity: Visualized.

Heart Rate: 166 bpm

CRL:   41.3 mm   11 w 0 d                  US EDC: 07/30/2020

Subchorionic hemorrhage: There is significant subchorionic
hemorrhage measuring 5.7 x 3.7 x 4.5 cm along the right inferior
aspect of the gestational sac.

Maternal uterus/adnexae: Uterus is enlarged and heterogeneous with
multiple fibroids, largest measuring 9.3 cm posteriorly and 9.9 cm
along the left fundal aspect. Right ovary measures 4.1 x 1.4 x
cm and the left ovary measures 4.5 x 2.6 x 2.9 cm. No adnexal masses
or free fluid.
IMPRESSION: 1. Single live intrauterine pregnancy as above, estimated age 11
weeks and 0 days.
2. Moderate to large subchorionic hemorrhage as above.
3. Numerous uterine fibroids.

## 2024-06-18 ENCOUNTER — Ambulatory Visit: Admitting: Family Medicine

## 2024-08-05 ENCOUNTER — Ambulatory Visit: Admitting: Internal Medicine
